# Patient Record
Sex: Male | Born: 1990 | Race: Asian | Hispanic: No | Marital: Single | Smoking: Current some day smoker
Health system: Southern US, Community
[De-identification: ages and names within clinical notes are randomized; demographics above are authoritative.]

## PROBLEM LIST (undated history)

## (undated) DIAGNOSIS — D126 Benign neoplasm of colon, unspecified: Secondary | ICD-10-CM

## (undated) DIAGNOSIS — K52839 Microscopic colitis, unspecified: Secondary | ICD-10-CM

## (undated) HISTORY — PX: EXTERNAL EAR SURGERY: SHX627

## (undated) HISTORY — DX: Benign neoplasm of colon, unspecified: D12.6

## (undated) HISTORY — PX: COLONOSCOPY W/ BIOPSIES: SHX1374

## (undated) HISTORY — DX: Microscopic colitis, unspecified: K52.839

---
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
## 2016-09-11 ENCOUNTER — Encounter (HOSPITAL_COMMUNITY): Payer: Self-pay | Admitting: Emergency Medicine

## 2016-09-11 ENCOUNTER — Ambulatory Visit (HOSPITAL_COMMUNITY)
Admission: EM | Admit: 2016-09-11 | Discharge: 2016-09-11 | Disposition: A | Discharge status: Home / Self Care | Payer: Self-pay | Attending: Emergency Medicine | Admitting: Emergency Medicine

## 2016-09-11 DIAGNOSIS — R350 Frequency of micturition: Secondary | ICD-10-CM | POA: Insufficient documentation

## 2016-09-11 DIAGNOSIS — R300 Dysuria: Secondary | ICD-10-CM | POA: Insufficient documentation

## 2016-09-11 DIAGNOSIS — N342 Other urethritis: Secondary | ICD-10-CM

## 2016-09-11 LAB — POCT URINALYSIS DIP (DEVICE)
BILIRUBIN URINE: NEGATIVE
GLUCOSE, UA: NEGATIVE mg/dL
HGB URINE DIPSTICK: NEGATIVE
Ketones, ur: NEGATIVE mg/dL
LEUKOCYTES UA: NEGATIVE
NITRITE: NEGATIVE
Protein, ur: NEGATIVE mg/dL
Specific Gravity, Urine: 1.015 (ref 1.005–1.030)
UROBILINOGEN UA: 0.2 mg/dL (ref 0.0–1.0)
pH: 6 (ref 5.0–8.0)

## 2016-09-11 MED ORDER — SULFAMETHOXAZOLE-TRIMETHOPRIM 800-160 MG PO TABS: 1.0000 | ORAL_TABLET | Freq: Two times a day (BID) | ORAL | 0 refills | Status: DC

## 2016-09-11 NOTE — Discharge Instructions (Addendum)
Drink plenty of water. Take meds as directed. Follow up with PCP of your choice or [place] for general medical issues. Go to Er for new or worsening issues.

## 2016-09-11 NOTE — ED Triage Notes (Signed)
Pt c/o UTI sx onset 2-3 month associated w/dysuria, urinary freq/urgency, abd pain and HA  Denies fevers, hematuria  A&O x4... NAD

## 2016-09-11 NOTE — ED Provider Notes (Signed)
CSN: [id number]     Arrival date & time 09/11/16  1627 History   None    Chief Complaint  Patient presents with  . Urinary Tract Infection   (Consider location/radiation/quality/duration/timing/severity/associated sxs/prior Treatment) 25 yr old male pt presents with ~ 2 month hx of dribbling with urination, frequency. Denies sexual activity for years. No fever, no N,V,D. New soap used per pt report.    The history is provided by the patient.    History reviewed. No pertinent past medical history. History reviewed. No pertinent surgical history. History reviewed. No pertinent family history. Social History  Substance Use Topics  . Smoking status: Never Smoker  . Smokeless tobacco: Never Used  . Alcohol use Yes    Review of Systems  Constitutional: Negative.   HENT: Negative.   Eyes: Negative.   Respiratory: Negative.   Cardiovascular: Negative.   Gastrointestinal: Negative for abdominal pain, nausea and vomiting.  Endocrine: Negative.   Genitourinary: Positive for frequency. Negative for difficulty urinating, discharge, dysuria, flank pain, hematuria, penile pain, penile swelling, scrotal swelling and testicular pain.  Musculoskeletal: Negative for back pain.  Skin: Negative for rash.  Allergic/Immunologic: Negative.   Neurological: Negative.   Hematological: Negative.   Psychiatric/Behavioral: Negative.   All other systems reviewed and are negative.   Allergies  Review of patient's allergies indicates no known allergies.  Home Medications   Prior to Admission medications   Medication Sig Start Date End Date Taking? Authorizing Provider  sulfamethoxazole-trimethoprim (BACTRIM DS,SEPTRA DS) 800-160 MG tablet Take 1 tablet by mouth 2 (two) times daily. [id number]   Tori Milks, NP   Meds Ordered and Administered this Visit  Medications - No data to display  BP 134/64 (BP Location: Left Arm)   Pulse 61   Temp 98.4 F (36.9 C) (Oral)   Resp 12   SpO2 100%   No data found.   Physical Exam  Constitutional: He is oriented to person, place, and time. Vital signs are normal. He appears well-developed and well-nourished. He is active and cooperative.  Non-toxic appearance. He has a sickly appearance. He does not appear ill. No distress.  HENT:  Head: Normocephalic.  Right Ear: Tympanic membrane normal.  Left Ear: Tympanic membrane normal.  Nose: Nose normal.  Mouth/Throat: Uvula is midline, oropharynx is clear and moist and mucous membranes are normal. Mucous membranes are not dry.  Eyes: Conjunctivae, EOM and lids are normal. Pupils are equal, round, and reactive to light.  Neck: Trachea normal and normal range of motion.  Cardiovascular: Normal rate, regular rhythm, normal heart sounds and normal pulses.   Pulmonary/Chest: Effort normal and breath sounds normal.  Abdominal: Soft. Normal appearance and bowel sounds are normal. There is no tenderness. There is no rebound and no guarding. No hernia.  Musculoskeletal: Normal range of motion.  Neurological: He is alert and oriented to person, place, and time. No cranial nerve deficit or sensory deficit. GCS eye subscore is 4. GCS verbal subscore is 5. GCS motor subscore is 6.  Skin: Skin is warm, dry and intact. No rash noted.  Psychiatric: He has a normal mood and affect. His speech is normal and behavior is normal.  Nursing note and vitals reviewed.   Urgent Care Course   Clinical Course    Procedures (including critical care time)  Labs Review Labs Reviewed  POCT URINALYSIS DIP (DEVICE)  URINE CYTOLOGY ANCILLARY ONLY    Recent Results (from the past 2160 hour(s))  POCT urinalysis dip (device)  Status: None   Collection Time: 09/11/16  5:46 PM  Result Value Ref Range   Glucose, UA NEGATIVE NEGATIVE mg/dL   Bilirubin Urine NEGATIVE NEGATIVE   Ketones, ur NEGATIVE NEGATIVE mg/dL   Specific Gravity, Urine 1.015 1.005 - 1.030   Hgb urine dipstick NEGATIVE NEGATIVE   pH 6.0 5.0 -  8.0   Protein, ur NEGATIVE NEGATIVE mg/dL   Urobilinogen, UA 0.2 0.0 - 1.0 mg/dL   Nitrite NEGATIVE NEGATIVE   Leukocytes, UA NEGATIVE NEGATIVE    Comment: Biochemical Testing Only. Please order routine urinalysis from main lab if confirmatory testing is needed.    Bilateral Near:         MDM   1. Dysuria   2. Urethritis   3. Urinary frequency     Discussed results with pt, will treat for possible urethritis/prostatitis. Drink plenty of water. Take meds as directed. Follow up with PCP of your choice or [place] for general medical issues. Go to Er for new or worsening issues. Pt verbalized understanding ot thsi provider.   DDX: kidney stone, obstruction, UTI, STD.    Tori Milks, NP [id number]

## 2016-09-14 LAB — URINE CYTOLOGY ANCILLARY ONLY
Chlamydia: NEGATIVE
Neisseria Gonorrhea: NEGATIVE
Trichomonas: NEGATIVE

## 2016-10-07 ENCOUNTER — Encounter (HOSPITAL_COMMUNITY): Payer: Self-pay | Admitting: Emergency Medicine

## 2016-10-07 ENCOUNTER — Emergency Department (HOSPITAL_COMMUNITY)
Admission: EM | Admit: 2016-10-07 | Discharge: 2016-10-07 | Disposition: A | Discharge status: Home / Self Care | Payer: Self-pay | Attending: Emergency Medicine | Admitting: Emergency Medicine

## 2016-10-07 ENCOUNTER — Emergency Department (HOSPITAL_COMMUNITY): Payer: Self-pay

## 2016-10-07 DIAGNOSIS — R197 Diarrhea, unspecified: Secondary | ICD-10-CM | POA: Insufficient documentation

## 2016-10-07 DIAGNOSIS — F17200 Nicotine dependence, unspecified, uncomplicated: Secondary | ICD-10-CM | POA: Insufficient documentation

## 2016-10-07 DIAGNOSIS — Z79899 Other long term (current) drug therapy: Secondary | ICD-10-CM | POA: Insufficient documentation

## 2016-10-07 DIAGNOSIS — E860 Dehydration: Secondary | ICD-10-CM | POA: Insufficient documentation

## 2016-10-07 LAB — URINALYSIS, ROUTINE W REFLEX MICROSCOPIC
Bilirubin Urine: NEGATIVE
Glucose, UA: NEGATIVE mg/dL
Hgb urine dipstick: NEGATIVE
Ketones, ur: NEGATIVE mg/dL
Leukocytes, UA: NEGATIVE
Nitrite: NEGATIVE
Protein, ur: NEGATIVE mg/dL
Specific Gravity, Urine: 1.022 (ref 1.005–1.030)
pH: 6.5 (ref 5.0–8.0)

## 2016-10-07 LAB — COMPREHENSIVE METABOLIC PANEL
ALBUMIN: 4.3 g/dL (ref 3.5–5.0)
ALK PHOS: 59 U/L (ref 38–126)
ALT: 59 U/L (ref 17–63)
AST: 39 U/L (ref 15–41)
Anion gap: 8 (ref 5–15)
BILIRUBIN TOTAL: 0.5 mg/dL (ref 0.3–1.2)
BUN: 19 mg/dL (ref 6–20)
CALCIUM: 9 mg/dL (ref 8.9–10.3)
CO2: 27 mmol/L (ref 22–32)
CREATININE: 1.25 mg/dL — AB (ref 0.61–1.24)
Chloride: 102 mmol/L (ref 101–111)
GFR calc Af Amer: >60 mL/min (ref >60)
GLUCOSE: 91 mg/dL (ref 65–99)
POTASSIUM: 3.6 mmol/L (ref 3.5–5.1)
Sodium: 137 mmol/L (ref 135–145)
TOTAL PROTEIN: 7.4 g/dL (ref 6.5–8.1)

## 2016-10-07 LAB — CBC WITH DIFFERENTIAL/PLATELET
BASOS ABS: 0 10*3/uL (ref 0.0–0.1)
BASOS PCT: 0 %
Eosinophils Absolute: 0.1 10*3/uL (ref 0.0–0.7)
Eosinophils Relative: 2 %
HEMATOCRIT: 40.1 % (ref 39.0–52.0)
HEMOGLOBIN: 13.6 g/dL (ref 13.0–17.0)
LYMPHS PCT: 28 %
Lymphs Abs: 2.4 10*3/uL (ref 0.7–4.0)
MCH: 25.8 pg — ABNORMAL LOW (ref 26.0–34.0)
MCHC: 33.9 g/dL (ref 30.0–36.0)
MCV: 76.1 fL — AB (ref 78.0–100.0)
Monocytes Absolute: 0.6 10*3/uL (ref 0.1–1.0)
Monocytes Relative: 7 %
NEUTROS ABS: 5.5 10*3/uL (ref 1.7–7.7)
NEUTROS PCT: 63 %
Platelets: 260 10*3/uL (ref 150–400)
RBC: 5.27 MIL/uL (ref 4.22–5.81)
RDW: 13 % (ref 11.5–15.5)
WBC: 8.6 10*3/uL (ref 4.0–10.5)

## 2016-10-07 LAB — PROTIME-INR
INR: 0.95
Prothrombin Time: 12.7 seconds (ref 11.4–15.2)

## 2016-10-07 LAB — LIPASE, BLOOD: Lipase: 20 U/L (ref 11–51)

## 2016-10-07 MED ORDER — DICYCLOMINE HCL 20 MG PO TABS: 20.0000 mg | ORAL_TABLET | Freq: Three times a day (TID) | ORAL | 0 refills | Status: DC

## 2016-10-07 MED ORDER — LOPERAMIDE HCL 2 MG PO CAPS: 2.0000 mg | ORAL_CAPSULE | Freq: Four times a day (QID) | ORAL | 0 refills | Status: DC | PRN

## 2016-10-07 NOTE — ED Provider Notes (Signed)
[facility] DEPT Provider Note   CSN: [id number] Arrival date & time: 10/07/16  1722     History   Chief Complaint Chief Complaint  Patient presents with  . Urinary Tract Infection    HPI Eric Sharp is a 25 y.o. male.  HPI 25 year old male with no significant past medical history presents with diffuse abdominal pain, diarrhea, and intermittent urinary frequency. The patient states that over the last 6 weeks, he has had intermittent, crampy-like, diffuse abdominal pain. It is usually associated with eating and then is associated with nonbloody, non-bilious diarrhea. He has had no nausea or vomiting over this time. He also notes intermittent abdominal distention that improves with bowel movements. Of note, he also states that when he gets the cramps, he occasionally gets the urge to urinate. He was seen recently at urgent care for these symptoms and prescribed Bactrim for possible UTI, although patient has no fever or other symptoms. Urinalysis at urgent care was unremarkable. Denies any other systemic complaints. Denies any recent travel outside the [place]. No recent antibiotic use. Of note, he does endorse heavy alcohol use daily.  History reviewed. No pertinent past medical history.  There are no active problems to display for this patient.   History reviewed. No pertinent surgical history.     Home Medications    Prior to Admission medications   Medication Sig Start Date End Date Taking? Authorizing Provider  dicyclomine (BENTYL) 20 MG tablet Take 1 tablet (20 mg total) by mouth 4 (four) times daily -  before meals and at bedtime. 10/07/16   Duffy Bruce, MD  loperamide (IMODIUM) 2 MG capsule Take 1 capsule (2 mg total) by mouth 4 (four) times daily as needed for diarrhea or loose stools. 10/07/16   Duffy Bruce, MD  sulfamethoxazole-trimethoprim (BACTRIM DS,SEPTRA DS) 800-160 MG tablet Take 1 tablet by mouth 2 (two) times daily. [id number]   Tori Milks, NP     Family History History reviewed. No pertinent family history.  Social History Social History  Substance Use Topics  . Smoking status: Current Every Day Smoker  . Smokeless tobacco: Never Used  . Alcohol use Yes     Allergies   Patient has no known allergies.   Review of Systems Review of Systems  Constitutional: Negative for chills, fatigue and fever.  HENT: Negative for congestion and rhinorrhea.   Eyes: Negative for visual disturbance.  Respiratory: Negative for cough, shortness of breath and wheezing.   Cardiovascular: Negative for chest pain and leg swelling.  Gastrointestinal: Positive for abdominal distention and diarrhea. Negative for abdominal pain, blood in stool and vomiting.  Genitourinary: Negative for dysuria and flank pain.  Musculoskeletal: Negative for neck pain and neck stiffness.  Skin: Negative for rash and wound.  Allergic/Immunologic: Negative for immunocompromised state.  Neurological: Negative for syncope, weakness and headaches.  All other systems reviewed and are negative.    Physical Exam Updated Vital Signs BP 139/84 (BP Location: Left Arm)   Pulse 79   Temp 98.6 F (37 C) (Oral)   Resp 20   Ht 5\' 5"  (1.651 m)   Wt 185 lb (83.9 kg)   SpO2 97%   BMI 30.79 kg/m   Physical Exam  Constitutional: He is oriented to person, place, and time. He appears well-developed and well-nourished. No distress.  HENT:  Head: Normocephalic and atraumatic.  Eyes: Conjunctivae are normal.  Neck: Neck supple.  Cardiovascular: Normal rate, regular rhythm and normal heart sounds.  Exam reveals no friction rub.  No murmur heard. Pulmonary/Chest: Effort normal and breath sounds normal. No respiratory distress. He has no wheezes. He has no rales.  Abdominal: Soft. Bowel sounds are normal. He exhibits no distension. There is no tenderness. There is no rebound and no guarding.  Musculoskeletal: He exhibits no edema.  Neurological: He is alert and oriented  to person, place, and time. He exhibits normal muscle tone.  Skin: Skin is warm. Capillary refill takes less than 2 seconds.  Psychiatric: He has a normal mood and affect.  Nursing note and vitals reviewed.    ED Treatments / Results  Labs (all labs ordered are listed, but only abnormal results are displayed) Labs Reviewed  CBC WITH DIFFERENTIAL/PLATELET - Abnormal; Notable for the following:       Result Value   MCV 76.1 (*)    MCH 25.8 (*)    All other components within normal limits  COMPREHENSIVE METABOLIC PANEL - Abnormal; Notable for the following:    Creatinine, Ser 1.25 (*)    All other components within normal limits  GASTROINTESTINAL PANEL BY PCR, STOOL (REPLACES STOOL CULTURE)  URINALYSIS, ROUTINE W REFLEX MICROSCOPIC (NOT AT [facility])  PROTIME-INR  LIPASE, BLOOD    EKG  EKG Interpretation None       Radiology Dg Abdomen 1 View  Result Date: 10/07/2016 CLINICAL DATA:  Abdominal distention and diarrhea EXAM: ABDOMEN - 1 VIEW COMPARISON:  None. FINDINGS: There is moderate stool throughout the colon. There is no bowel dilatation or air-fluid level suggesting bowel obstruction. No free air. No abnormal calcifications. IMPRESSION: Moderate stool in colon.  No bowel obstruction or free air evident. Electronically Signed   By: Lowella Grip III M.D.   On: 10/07/2016 19:40    Procedures Procedures (including critical care time)  Medications Ordered in ED Medications - No data to display   Initial Impression / Assessment and Plan / ED Course  I have reviewed the triage vital signs and the nursing notes.  Pertinent labs & imaging results that were available during my care of the patient were reviewed by me and considered in my medical decision making (see chart for details).  Clinical Course     25 yo M with PMHx of heavy EtOH abuse who p/w a several week h/o watery, non-bloody diarrhea with intermittent abdominal cramping. On arrival, VSS and WNL. Exam  reassuring with minimal to no TTP of abdomen, no ascites. Suspect viral GI illness, malabsorption diarrhea 2/2 EtOH use and poor diet, versus other infectious diarrhea. No recent travel outside of [place]. 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 No bloody stools, fevers, and labs reassuring - do not suspect significant bacterial colitis, diverticulitis, or IBD. LFTs wnl. UA without signs of UTI or dehydration. KUB non-obstructive with no apparent soft tissue masses.  Given reassuring labs, vitals, well appearance, and tolerance of PO, will obtain GI pathogen panel and d/c with symptomatic control. Will notify pt if results positive. Return precautions given. Advised pt to decrease EtOH use.  Pt home phone: [phone]  Final Clinical Impressions(s) / ED Diagnoses   Final diagnoses:  Diarrhea of presumed infectious origin  Dehydration    New Prescriptions New Prescriptions   DICYCLOMINE (BENTYL) 20 MG TABLET    Take 1 tablet (20 mg total) by mouth 4 (four) times daily -  before meals and at bedtime.   LOPERAMIDE (IMODIUM) 2 MG CAPSULE    Take 1 capsule (2 mg total) by mouth 4 (four) times daily as  needed for diarrhea or loose stools.     Duffy Bruce, MD 10/08/16 [phone]

## 2016-10-07 NOTE — ED Triage Notes (Signed)
Pt here with dysuria and frequency x 3 days

## 2016-10-07 NOTE — ED Notes (Signed)
Pt reports dysuria and abd pain with diarrhea x 4 weeks.  States he was seen about a week ago at the [facility] and was given Bactrim d/t UTI without relief.  Pt states having immediate diarrhea after eating, denies any pain but reports feeling "fullness" in his abdomen.

## 2016-10-10 ENCOUNTER — Telehealth (HOSPITAL_COMMUNITY): Payer: Self-pay | Admitting: Emergency Medicine

## 2016-10-10 NOTE — Telephone Encounter (Signed)
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 Patient previously on antibiotics prescribed by Urgent Care. I attempted to call patient x 2 and left a VM notifying him of results and advising him to: (1) stop taking antibiotics, as this can worsen the condition, (2) avoid anti-diarrheals, as this can prolong infection, and (3) encourage fluids, with return to ED if not improved. During ED encounter, patient had provided consent to leave VM as preferred method of communication. ED Follow-Up pool also aware. No new orders indicated.

## 2016-10-12 ENCOUNTER — Telehealth (HOSPITAL_COMMUNITY): Payer: Self-pay | Admitting: *Deleted

## 2016-10-12 NOTE — Telephone Encounter (Signed)
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 He was previously on antibiotics and was taking immodium for this. I was wondering if you could please notify the patient of his results and advise him to  1.) Stop taking his antibiotics, as this may worsen his condition 2.) Stop taking immodium as this may prolong the course of his illness  Please let me know if there's anything I can do.  Thanks, Duffy Bruce

## 2016-10-19 LAB — GASTROINTESTINAL PANEL BY PCR, STOOL (REPLACES STOOL CULTURE)
ASTROVIRUS: NOT DETECTED
Adenovirus F40/41: NOT DETECTED
CAMPYLOBACTER SPECIES: NOT DETECTED
Cryptosporidium: NOT DETECTED
Cyclospora cayetanensis: NOT DETECTED
E. coli O157: NOT DETECTED
ENTAMOEBA HISTOLYTICA: NOT DETECTED
ENTEROAGGREGATIVE E COLI (EAEC): NOT DETECTED
ENTEROPATHOGENIC E COLI (EPEC): NOT DETECTED
ENTEROTOXIGENIC E COLI (ETEC): NOT DETECTED
GIARDIA LAMBLIA: NOT DETECTED
NOROVIRUS GI/GII: NOT DETECTED
PLESIMONAS SHIGELLOIDES: NOT DETECTED
Rotavirus A: NOT DETECTED
SALMONELLA SPECIES: NOT DETECTED
SHIGELLA/ENTEROINVASIVE E COLI (EIEC): NOT DETECTED
Sapovirus (I, II, IV, and V): NOT DETECTED
Shiga like toxin producing E coli (STEC): DETECTED — AB
VIBRIO CHOLERAE: NOT DETECTED
Vibrio species: NOT DETECTED
Yersinia enterocolitica: NOT DETECTED

## 2016-10-27 LAB — MISCELLANEOUS TEST

## 2017-02-12 ENCOUNTER — Encounter (HOSPITAL_COMMUNITY): Payer: Self-pay

## 2017-02-12 ENCOUNTER — Emergency Department (HOSPITAL_COMMUNITY)
Admission: EM | Admit: 2017-02-12 | Discharge: 2017-02-12 | Disposition: A | Discharge status: Home / Self Care | Payer: Self-pay | Attending: Emergency Medicine | Admitting: Emergency Medicine

## 2017-02-12 DIAGNOSIS — F17200 Nicotine dependence, unspecified, uncomplicated: Secondary | ICD-10-CM | POA: Insufficient documentation

## 2017-02-12 DIAGNOSIS — R350 Frequency of micturition: Secondary | ICD-10-CM | POA: Insufficient documentation

## 2017-02-12 DIAGNOSIS — Z5321 Procedure and treatment not carried out due to patient leaving prior to being seen by health care provider: Secondary | ICD-10-CM | POA: Insufficient documentation

## 2017-02-12 DIAGNOSIS — Z79899 Other long term (current) drug therapy: Secondary | ICD-10-CM | POA: Insufficient documentation

## 2017-02-12 LAB — URINALYSIS, ROUTINE W REFLEX MICROSCOPIC
Bilirubin Urine: NEGATIVE
GLUCOSE, UA: NEGATIVE mg/dL
Hgb urine dipstick: NEGATIVE
KETONES UR: NEGATIVE mg/dL
LEUKOCYTES UA: NEGATIVE
NITRITE: NEGATIVE
PROTEIN: NEGATIVE mg/dL
Specific Gravity, Urine: 1.026 (ref 1.005–1.030)
pH: 5 (ref 5.0–8.0)

## 2017-02-12 NOTE — ED Provider Notes (Signed)
Patient apparently left without being seen by a provider.    Merrily Pew, MD 02/12/17 2053

## 2017-02-12 NOTE — ED Triage Notes (Signed)
Pt complaining of urinary frequency. Pt states needs to use restroom every few hours. Pt denies any painful urination. Pt denies any N/V/D. Pt denies any abdominal pain. Pt states seen here for similar issue. Pt states rx'd some medication for problem.

## 2017-03-10 ENCOUNTER — Emergency Department (HOSPITAL_COMMUNITY)
Admission: EM | Admit: 2017-03-10 | Discharge: 2017-03-10 | Disposition: A | Discharge status: Home / Self Care | Payer: Self-pay | Attending: Emergency Medicine | Admitting: Emergency Medicine

## 2017-03-10 DIAGNOSIS — R300 Dysuria: Secondary | ICD-10-CM

## 2017-03-10 DIAGNOSIS — E860 Dehydration: Secondary | ICD-10-CM | POA: Insufficient documentation

## 2017-03-10 DIAGNOSIS — F17200 Nicotine dependence, unspecified, uncomplicated: Secondary | ICD-10-CM | POA: Insufficient documentation

## 2017-03-10 LAB — URINALYSIS, ROUTINE W REFLEX MICROSCOPIC
Bilirubin Urine: NEGATIVE
Glucose, UA: NEGATIVE mg/dL
Hgb urine dipstick: NEGATIVE
Ketones, ur: NEGATIVE mg/dL
Leukocytes, UA: NEGATIVE
Nitrite: NEGATIVE
Protein, ur: NEGATIVE mg/dL
Specific Gravity, Urine: 1.023 (ref 1.005–1.030)
pH: 6 (ref 5.0–8.0)

## 2017-03-10 NOTE — ED Notes (Signed)
Papers and D/C instructions reviewed after PA went of instructions

## 2017-03-10 NOTE — ED Provider Notes (Signed)
[facility] DEPT Provider Note   CSN: [id number] Arrival date & time: 03/10/17  1452   By signing my name below, I, Evelene Croon, attest that this documentation has been prepared under the direction and in the presence of Shary Decamp, PA-C. Electronically Signed: Evelene Croon, Scribe. 03/10/2017. 3:40 PM.  History   Chief Complaint Chief Complaint  Patient presents with  . Dysuria   The history is provided by the patient. The history is limited by a language barrier. No language interpreter was used.     HPI Comments:  Eric Sharp is a 26 y.o. male who presents to the Emergency Department complaining of dysuria with associated urinary frequency x ~ 1 year. Pt states he has ben trying to come in to be evaluated for a while but has been busy.  Pt notes he often holds in his urine because he drives so much. Pt also notes he doesn't drink very much water daily. Pt denies penile discharge, hematuria, fever, abdominal pain, and back pain. No alleviating factors noted.   No past medical history on file.  There are no active problems to display for this patient.   No past surgical history on file.   Home Medications    Prior to Admission medications   Medication Sig Start Date End Date Taking? Authorizing Provider  dicyclomine (BENTYL) 20 MG tablet Take 1 tablet (20 mg total) by mouth 4 (four) times daily -  before meals and at bedtime. 10/07/16   Duffy Bruce, MD  loperamide (IMODIUM) 2 MG capsule Take 1 capsule (2 mg total) by mouth 4 (four) times daily as needed for diarrhea or loose stools. 10/07/16   Duffy Bruce, MD  sulfamethoxazole-trimethoprim (BACTRIM DS,SEPTRA DS) 800-160 MG tablet Take 1 tablet by mouth 2 (two) times daily. 32/20/25   Tori Milks, NP    Family History No family history on file.  Social History Social History  Substance Use Topics  . Smoking status: Current Every Day Smoker  . Smokeless tobacco: Never Used  . Alcohol use Yes     Allergies     Patient has no known allergies.   Review of Systems Review of Systems  Constitutional: Negative for chills and fever.  Respiratory: Negative for shortness of breath.   Cardiovascular: Negative for chest pain.  Genitourinary: Positive for dysuria and frequency. Negative for discharge and hematuria.   Physical Exam Updated Vital Signs BP 133/77 (BP Location: Left Arm)   Pulse 74   Temp 98.4 F (36.9 C) (Oral)   Resp 16   SpO2 99%   Physical Exam  Constitutional: He is oriented to person, place, and time. Vital signs are normal. He appears well-developed and well-nourished. No distress.  HENT:  Head: Normocephalic and atraumatic.  Right Ear: Hearing normal.  Left Ear: Hearing normal.  Eyes: Conjunctivae and EOM are normal. Pupils are equal, round, and reactive to light.  Cardiovascular: Normal rate and regular rhythm.   Pulmonary/Chest: Effort normal.  Abdominal: Soft. Bowel sounds are normal. He exhibits no distension. There is no tenderness. There is no CVA tenderness.  Neurological: He is alert and oriented to person, place, and time.  Skin: Skin is warm and dry.  Psychiatric: He has a normal mood and affect. His speech is normal and behavior is normal. Thought content normal.  Nursing note and vitals reviewed.  ED Treatments / Results  DIAGNOSTIC STUDIES:  Oxygen Saturation is 99% on RA, normal by my interpretation.    COORDINATION OF CARE:  3:34 PM Discussed  treatment plan with pt at bedside and pt agreed to plan.  Labs (all labs ordered are listed, but only abnormal results are displayed) Labs Reviewed  URINALYSIS, ROUTINE W REFLEX MICROSCOPIC   EKG  EKG Interpretation None      Radiology No results found.  Procedures Procedures (including critical care time)  Medications Ordered in ED Medications - No data to display   Initial Impression / Assessment and Plan / ED Course  I have reviewed the triage vital signs and the nursing notes.  Pertinent  labs & imaging results that were available during my care of the patient were reviewed by me and considered in my medical decision making (see chart for details).  {I have reviewed and evaluated the relevant laboratory values.   {I have reviewed the relevant previous healthcare records.  {I obtained HPI from historian.   ED Course:  Assessment: Pt is a 26 y.o. male who presents with intermittent dysuria x 1 year. Occasional urinary frequency. Pt states this occurs in the morning as well as prolonged periods when he does not drink water. Denies currently. No N/V. No fever. No back pain. No abdominal pain. On exam, pt in NAD. Nontoxic/nonseptic appearing. VSS. Afebrile. Abdomen nontender soft. UA unremarkable. Counseled on adequate hydration. Pt symptoms likely related to dehydration causing mild dysuria due to concentrated urine. Plan is to DC home with follow up to PCP. At time of discharge, Patient is in no acute distress. Vital Signs are stable. Patient is able to ambulate. Patient able to tolerate PO.   Disposition/Plan:  DC Home Additional Verbal discharge instructions given and discussed with patient.  Pt Instructed to f/u with PCP in the next week for evaluation and treatment of symptoms. Return precautions given Pt acknowledges and agrees with plan  Supervising Physician Virgel Manifold, MD   Final Clinical Impressions(s) / ED Diagnoses   Final diagnoses:  Dysuria  Dehydration    New Prescriptions New Prescriptions   No medications on file   I personally performed the services described in this documentation, which was scribed in my presence. The recorded information has been reviewed and is accurate.    Shary Decamp, PA-C 03/10/17 [place], MD 03/13/17 [phone]

## 2017-03-10 NOTE — ED Triage Notes (Signed)
Pt arrives via POv from home with dysuria for the last year. Pt c/o frequency, urgency and burning. Denies recent fever. VSS.

## 2017-03-10 NOTE — ED Notes (Signed)
PA at bedside.

## 2017-03-10 NOTE — Discharge Instructions (Signed)
Please read and follow all provided instructions.  Your diagnoses today include:  1. Dysuria   2. Dehydration     Tests performed today include: Vital signs. See below for your results today.   Medications prescribed:  Take as prescribed   Home care instructions:  Follow any educational materials contained in this packet.  Follow-up instructions: Please follow-up with your primary care provider for further evaluation of symptoms and treatment   Return instructions:  Please return to the Emergency Department if you do not get better, if you get worse, or new symptoms OR  - Fever (temperature greater than 101.57F)  - Bleeding that does not stop with holding pressure to the area    -Severe pain (please note that you may be more sore the day after your accident)  - Chest Pain  - Difficulty breathing  - Severe nausea or vomiting  - Inability to tolerate food and liquids  - Passing out  - Skin becoming red around your wounds  - Change in mental status (confusion or lethargy)  - New numbness or weakness    Please return if you have any other emergent concerns.  Additional Information:  Your vital signs today were: BP 133/77 (BP Location: Left Arm)    Pulse 74    Temp 98.4 F (36.9 C) (Oral)    Resp 16    SpO2 99%  If your blood pressure (BP) was elevated above 135/85 this visit, please have this repeated by your doctor within one month. ---------------

## 2017-07-03 ENCOUNTER — Ambulatory Visit (HOSPITAL_COMMUNITY)
Admission: EM | Admit: 2017-07-03 | Discharge: 2017-07-03 | Disposition: A | Discharge status: Home / Self Care | Payer: PRIVATE HEALTH INSURANCE | Attending: Radiology | Admitting: Radiology

## 2017-07-03 ENCOUNTER — Encounter (HOSPITAL_COMMUNITY): Payer: Self-pay | Admitting: *Deleted

## 2017-07-03 DIAGNOSIS — R109 Unspecified abdominal pain: Secondary | ICD-10-CM

## 2017-07-03 DIAGNOSIS — R197 Diarrhea, unspecified: Secondary | ICD-10-CM | POA: Diagnosis not present

## 2017-07-03 DIAGNOSIS — F17200 Nicotine dependence, unspecified, uncomplicated: Secondary | ICD-10-CM | POA: Insufficient documentation

## 2017-07-03 LAB — POCT H PYLORI SCREEN: H. PYLORI SCREEN, POC: POSITIVE — AB

## 2017-07-03 LAB — POCT URINALYSIS DIP (DEVICE)
BILIRUBIN URINE: NEGATIVE
GLUCOSE, UA: NEGATIVE mg/dL
Ketones, ur: NEGATIVE mg/dL
Leukocytes, UA: NEGATIVE
NITRITE: NEGATIVE
PH: 6 (ref 5.0–8.0)
PROTEIN: NEGATIVE mg/dL
Specific Gravity, Urine: 1.025 (ref 1.005–1.030)
Urobilinogen, UA: 0.2 mg/dL (ref 0.0–1.0)

## 2017-07-03 MED ORDER — BISMUTH SUBSALICYLATE 262 MG/15ML PO SUSP: 30.0000 mL | Freq: Four times a day (QID) | ORAL | 0 refills | Status: AC | PRN

## 2017-07-03 MED ORDER — TETRACYCLINE HCL 250 MG PO CAPS
250.0000 mg | ORAL_CAPSULE | Freq: Four times a day (QID) | ORAL | 0 refills | Status: AC
Start: 2017-07-03 — End: 2017-07-17

## 2017-07-03 MED ORDER — METRONIDAZOLE 500 MG PO TABS: 500.0000 mg | ORAL_TABLET | Freq: Two times a day (BID) | ORAL | 0 refills | Status: DC

## 2017-07-03 MED ORDER — CIPROFLOXACIN HCL 500 MG PO TABS: 500.0000 mg | ORAL_TABLET | Freq: Two times a day (BID) | ORAL | 0 refills | Status: DC

## 2017-07-03 NOTE — ED Triage Notes (Signed)
Pt  Reports  Low standing    History  Of  Frequent urination     As   Well as   abd  Pain    He  Reports   Loose  Stools  But  Not  Diarrhea   As   Well      Pt  Is  Sitting  Upright on  Exam table  Speaking in  Complete  sentances

## 2017-07-03 NOTE — ED Provider Notes (Signed)
CSN: [id number]     Arrival date & time 07/03/17  1403 History   First MD Initiated Contact with Patient 07/03/17 1436     Chief Complaint  Patient presents with  . Abdominal Pain   (Consider location/radiation/quality/duration/timing/severity/associated sxs/prior Treatment) 26 y.o. male presents with abdominal pain and increase in urinary frequency X 6-7  months. Patient denies any nausea, vomiting. Endorses diarrhea upon waking and after eating. Condition is chronic in nature. Condition is made better as the day progresses. Condition is made worse by sodas and pepper. Patient denies any relief from unknown prescribed mediation given approximately 4-5  Months  prior to there arrival at this facility.        History reviewed. No pertinent past medical history. History reviewed. No pertinent surgical history. History reviewed. No pertinent family history. Social History  Substance Use Topics  . Smoking status: Current Every Day Smoker  . Smokeless tobacco: Never Used  . Alcohol use Yes    Review of Systems  Constitutional: Negative for chills and fever.  HENT: Negative for ear pain and sore throat.   Eyes: Negative for pain and visual disturbance.  Respiratory: Negative for cough and shortness of breath.   Cardiovascular: Negative for chest pain and palpitations.  Gastrointestinal: Positive for abdominal pain and diarrhea. Negative for vomiting.  Genitourinary: Positive for frequency. Negative for dysuria and hematuria.  Musculoskeletal: Negative for arthralgias and back pain.  Skin: Negative for color change and rash.  Neurological: Negative for seizures and syncope.  All other systems reviewed and are negative.   Allergies  Patient has no known allergies.  Home Medications   Prior to Admission medications   Medication Sig Start Date End Date Taking? Authorizing Provider  bismuth subsalicylate (PEPTO-BISMOL) 262 MG/15ML suspension Take 30 mLs by mouth every 6 (six) hours  as needed. 07/03/17 07/17/17  Jacqualine Mau, NP  ciprofloxacin (CIPRO) 500 MG tablet Take 1 tablet (500 mg total) by mouth every 12 (twelve) hours. 07/03/17   Jacqualine Mau, NP  dicyclomine (BENTYL) 20 MG tablet Take 1 tablet (20 mg total) by mouth 4 (four) times daily -  before meals and at bedtime. 10/07/16   Duffy Bruce, MD  loperamide (IMODIUM) 2 MG capsule Take 1 capsule (2 mg total) by mouth 4 (four) times daily as needed for diarrhea or loose stools. 10/07/16   Duffy Bruce, MD  metroNIDAZOLE (FLAGYL) 500 MG tablet Take 1 tablet (500 mg total) by mouth 2 (two) times daily. 07/03/17   Jacqualine Mau, NP  sulfamethoxazole-trimethoprim (BACTRIM DS,SEPTRA DS) 800-160 MG tablet Take 1 tablet by mouth 2 (two) times daily. 40/98/11   Defelice, Jeanett Schlein, NP  tetracycline (ACHROMYCIN,SUMYCIN) 250 MG capsule Take 1 capsule (250 mg total) by mouth 4 (four) times daily. 07/03/17 07/17/17  Jacqualine Mau, NP   Meds Ordered and Administered this Visit  Medications - No data to display  BP 130/68 (BP Location: Right Arm)   Pulse 80   Temp 98.6 F (37 C) (Oral)   Resp 18   SpO2 100%  No data found.   Physical Exam  Constitutional: He appears well-developed and well-nourished.  HENT:  Head: Normocephalic and atraumatic.  Eyes: Conjunctivae are normal.  Neck: Neck supple.  Cardiovascular: Normal rate and regular rhythm.   No murmur heard. Pulmonary/Chest: Effort normal and breath sounds normal. No respiratory distress.  Abdominal: Soft. There is no tenderness.  Musculoskeletal: He exhibits no edema.  Neurological: He is alert.  Skin: Skin is warm and  dry.  Psychiatric: He has a normal mood and affect.  Nursing note and vitals reviewed.   Urgent Care Course     Procedures (including critical care time)  Labs Review Labs Reviewed  POCT H PYLORI SCREEN - Abnormal; Notable for the following:       Result Value   H. PYLORI SCREEN, POC POSITIVE (*)    All other  components within normal limits  POCT URINALYSIS DIP (DEVICE) - Abnormal; Notable for the following:    Hgb urine dipstick TRACE (*)    All other components within normal limits  URINE CULTURE    Imaging Review No results found.     MDM   1. Abdominal pain, unspecified abdominal location       Jacqualine Mau, NP 07/03/17 1523

## 2017-07-04 LAB — URINE CULTURE: CULTURE: NO GROWTH

## 2017-10-12 ENCOUNTER — Encounter: Payer: Self-pay | Admitting: Physician Assistant

## 2017-10-26 ENCOUNTER — Encounter (INDEPENDENT_AMBULATORY_CARE_PROVIDER_SITE_OTHER): Payer: Self-pay

## 2017-10-26 ENCOUNTER — Ambulatory Visit: Payer: PRIVATE HEALTH INSURANCE | Admitting: Physician Assistant

## 2017-10-26 ENCOUNTER — Other Ambulatory Visit (INDEPENDENT_AMBULATORY_CARE_PROVIDER_SITE_OTHER): Payer: PRIVATE HEALTH INSURANCE

## 2017-10-26 ENCOUNTER — Encounter: Payer: Self-pay | Admitting: Physician Assistant

## 2017-10-26 VITALS — BP 106/72 | HR 84 | Ht 63.0 in | Wt 186.0 lb

## 2017-10-26 DIAGNOSIS — R195 Other fecal abnormalities: Secondary | ICD-10-CM

## 2017-10-26 DIAGNOSIS — R1084 Generalized abdominal pain: Secondary | ICD-10-CM

## 2017-10-26 DIAGNOSIS — R109 Unspecified abdominal pain: Secondary | ICD-10-CM

## 2017-10-26 LAB — CBC WITH DIFFERENTIAL/PLATELET
BASOS PCT: 0.4 % (ref 0.0–3.0)
Basophils Absolute: 0 10*3/uL (ref 0.0–0.1)
EOS PCT: 2.1 % (ref 0.0–5.0)
Eosinophils Absolute: 0.1 10*3/uL (ref 0.0–0.7)
HCT: 45.5 % (ref 39.0–52.0)
Hemoglobin: 15.2 g/dL (ref 13.0–17.0)
LYMPHS ABS: 2.3 10*3/uL (ref 0.7–4.0)
LYMPHS PCT: 37.3 % (ref 12.0–46.0)
MCHC: 33.4 g/dL (ref 30.0–36.0)
MCV: 78.1 fl (ref 78.0–100.0)
MONO ABS: 0.4 10*3/uL (ref 0.1–1.0)
Monocytes Relative: 6.2 % (ref 3.0–12.0)
NEUTROS ABS: 3.3 10*3/uL (ref 1.4–7.7)
NEUTROS PCT: 54 % (ref 43.0–77.0)
PLATELETS: 338 10*3/uL (ref 150.0–400.0)
RBC: 5.83 Mil/uL — ABNORMAL HIGH (ref 4.22–5.81)
RDW: 13.1 % (ref 11.5–15.5)
WBC: 6.2 10*3/uL (ref 4.0–10.5)

## 2017-10-26 LAB — HIGH SENSITIVITY CRP: CRP, High Sensitivity: 2.47 mg/L (ref 0.000–5.000)

## 2017-10-26 LAB — COMPREHENSIVE METABOLIC PANEL
ALBUMIN: 5.1 g/dL (ref 3.5–5.2)
ALK PHOS: 68 U/L (ref 39–117)
ALT: 49 U/L (ref 0–53)
AST: 27 U/L (ref 0–37)
BUN: 14 mg/dL (ref 6–23)
CALCIUM: 10 mg/dL (ref 8.4–10.5)
CO2: 27 mEq/L (ref 19–32)
CREATININE: 1.08 mg/dL (ref 0.40–1.50)
Chloride: 100 mEq/L (ref 96–112)
GFR: 88.87 mL/min (ref >60.00)
Glucose, Bld: 90 mg/dL (ref 70–99)
POTASSIUM: 4 meq/L (ref 3.5–5.1)
SODIUM: 138 meq/L (ref 135–145)
TOTAL PROTEIN: 8.8 g/dL — AB (ref 6.0–8.3)
Total Bilirubin: 0.7 mg/dL (ref 0.2–1.2)

## 2017-10-26 LAB — SEDIMENTATION RATE: Sed Rate: 13 mm/hr (ref 0–15)

## 2017-10-26 MED ORDER — DICYCLOMINE HCL 10 MG PO CAPS: 10.0000 mg | ORAL_CAPSULE | Freq: Three times a day (TID) | ORAL | 2 refills | Status: DC

## 2017-10-26 NOTE — Progress Notes (Signed)
Subjective:    Patient ID: Eric Sharp, male    DOB: [birth year], 26 y.o.   MRN: [id number]  HPI Chipley is a pleasant 26 year old [place] male, new to GI today. He states he was referred by [facility] after a visit there in August 2018. He presents with complaints of ongoing abdominal pain. He was found to be H. pylori positive when he had the urgent care visit in August and states that he was treated with antibiotics but his symptoms did not change. Patient states that these had ongoing abdominal pain over the past year which has waxed and waned at times but never completely goes away. He says his oral abdomen is uncomfortable and he describes it as a burning or pressure type feeling. He feels more pressure in his lower abdomen. He has been having at least 2-3 loose bowel movements each morning which is new for him over the past year and also having urgency postprandially with cramping and loose stools. His appetite has been okay, no nausea or vomiting, no weight loss. He is not aware of any fevers or chills. He says he is intolerant to sodas and Green pepper. He is also concerned because he is been having very frequent urination over the past year, at least once per hour during the day. Family history is negative for GI diseases far as he is aware. He has not had any recent imaging.  Review of Systems Pertinent positive and negative review of systems were noted in the above HPI section.  All other review of systems was otherwise negative.  Outpatient Encounter Medications as of 10/26/2017  Medication Sig  . dicyclomine (BENTYL) 10 MG capsule Take 1 capsule (10 mg total) by mouth 3 (three) times daily.  . [DISCONTINUED] ciprofloxacin (CIPRO) 500 MG tablet Take 1 tablet (500 mg total) by mouth every 12 (twelve) hours.  . [DISCONTINUED] dicyclomine (BENTYL) 20 MG tablet Take 1 tablet (20 mg total) by mouth 4 (four) times daily -  before meals and at bedtime.  . [DISCONTINUED] loperamide  (IMODIUM) 2 MG capsule Take 1 capsule (2 mg total) by mouth 4 (four) times daily as needed for diarrhea or loose stools.  . [DISCONTINUED] metroNIDAZOLE (FLAGYL) 500 MG tablet Take 1 tablet (500 mg total) by mouth 2 (two) times daily.  . [DISCONTINUED] sulfamethoxazole-trimethoprim (BACTRIM DS,SEPTRA DS) 800-160 MG tablet Take 1 tablet by mouth 2 (two) times daily.   No facility-administered encounter medications on file as of 10/26/2017.    No Known Allergies There are no active problems to display for this patient.  Social History   Socioeconomic History  . Marital status: Single    Spouse name: Not on file  . Number of children: 0  . Years of education: Not on file  . Highest education level: Not on file  Social Needs  . Financial resource strain: Not on file  . Food insecurity - worry: Not on file  . Food insecurity - inability: Not on file  . Transportation needs - medical: Not on file  . Transportation needs - non-medical: Not on file  Occupational History  . Not on file  Tobacco Use  . Smoking status: Current Every Day Smoker  . Smokeless tobacco: Never Used  Substance and Sexual Activity  . Alcohol use: Yes    Comment: weekends  . Drug use: Yes    Types: Marijuana  . Sexual activity: Not on file  Other Topics Concern  . Not on file  Social History  Narrative  . Not on file    Mr. Saladin family history includes Stroke in his mother.      Objective:    Vitals:   10/26/17 1021  BP: 106/72  Pulse: 84    Physical Exam well-developed young [place] male in no acute distress, blood pressure 106/72 pulse 84, BMI 32.9. HEENT ;nontraumatic normocephalic EOMI PERRLA sclera anicteric, Cardiovascular; regular rate and rhythm with S1-S2 no murmur or gallop, Pulmonary ;clear bilaterally, Abdomen ;soft, he's tender in the left lower and left mid quadrant also tender in the right lower quadrant there is no palpable mass or hepatosplenomegaly bowel sounds are active Rectal  ;exam not done, Extremities; no clubbing cyanosis or edema skin warm and dry, Neuropsych; mood and affect appropriate       Assessment & Plan:   #63 26 year old [place] male with 1 year history of ongoing abdominal pain described as burning and pressure, and crampy at times. This is been associated with loose stools with 2-3 early morning bowel movements and then urgency postprandially. Etiology of symptoms is unclear, will need to rule out IBD i.e. Crohn's or colitis versus possible IBS, rule out other intra-abdominal inflammatory process #2  history of H. pylori, treated August 2018 #3 urinary frequency  Plan; CBC with differential, see met, sedimentation rate, UA Schedule for CT scan of the abdomen and pelvis with contrast Discussed possible need for colonoscopy with patient depending on labs and CT scan. We will start trial of Bentyl 10 mg by mouth 3 times a day before meals. Patient will be established with Dr. Silverio Decamp.    Davarius Ridener Genia Harold PA-C 10/26/2017   Cc: No ref. provider found

## 2017-10-26 NOTE — Progress Notes (Signed)
Reviewed and agree with documentation and assessment and plan. K. Veena Jaya Lapka , MD   

## 2017-10-26 NOTE — Patient Instructions (Signed)
Your physician has requested that you go to the basement for lab work before leaving today  We have sent the following medications to your pharmacy for you to pick up at your convenience:  Bentyl  You have been scheduled for a CT scan of the abdomen and pelvis at [facility] ([address]---this is in the same building as [facility]).   You are scheduled on 11/09/2017 at 1:00pm. You should arrive 15 minutes prior to your appointment time for registration. Please follow the written instructions below on the day of your exam:  WARNING: IF YOU ARE ALLERGIC TO IODINE/X-RAY DYE, PLEASE NOTIFY RADIOLOGY IMMEDIATELY AT [phone]! YOU WILL BE GIVEN A 13 HOUR PREMEDICATION PREP.  1) Do not eat or drink anything after 9:00am(4 hours prior to your test) 2) You have been given 2 bottles of oral contrast to drink. The solution may taste               better if refrigerated, but do NOT add ice or any other liquid to this solution. Shake             well before drinking.    Drink 1 bottle of contrast @ 11:00am (2 hours prior to your exam)  Drink 1 bottle of contrast @ 12:00pm (1 hour prior to your exam)  You may take any medications as prescribed with a small amount of water except for the following: Metformin, Glucophage, Glucovance, Avandamet, Riomet, Fortamet, Actoplus Met, Janumet, Glumetza or Metaglip. The above medications must be held the day of the exam AND 48 hours after the exam.  The purpose of you drinking the oral contrast is to aid in the visualization of your intestinal tract. The contrast solution may cause some diarrhea. Before your exam is started, you will be given a small amount of fluid to drink. Depending on your individual set of symptoms, you may also receive an intravenous injection of x-ray contrast/dye. Plan on being at [facility] for 30 minutes or long, depending on the type of exam you are having performed.  If you have any questions regarding your  exam or if you need to reschedule, you may call the CT department at [phone] between the hours of 8:00 am and 5:00 pm, Monday-Friday.  ________________________________________________________________________

## 2017-11-09 ENCOUNTER — Inpatient Hospital Stay: Admission: RE | Admit: 2017-11-09 | Payer: PRIVATE HEALTH INSURANCE | Source: Ambulatory Visit

## 2017-11-11 ENCOUNTER — Inpatient Hospital Stay: Admission: RE | Admit: 2017-11-11 | Payer: PRIVATE HEALTH INSURANCE | Source: Ambulatory Visit

## 2017-11-25 ENCOUNTER — Ambulatory Visit (INDEPENDENT_AMBULATORY_CARE_PROVIDER_SITE_OTHER)
Admission: RE | Admit: 2017-11-25 | Discharge: 2017-11-25 | Disposition: A | Discharge status: Home / Self Care | Payer: PRIVATE HEALTH INSURANCE | Source: Ambulatory Visit | Attending: Physician Assistant | Admitting: Physician Assistant

## 2017-11-25 DIAGNOSIS — R1084 Generalized abdominal pain: Secondary | ICD-10-CM | POA: Diagnosis not present

## 2017-11-25 DIAGNOSIS — R109 Unspecified abdominal pain: Secondary | ICD-10-CM

## 2017-11-25 DIAGNOSIS — R195 Other fecal abnormalities: Secondary | ICD-10-CM

## 2017-11-25 MED ORDER — IOPAMIDOL (ISOVUE-300) INJECTION 61%
100.0000 mL | Freq: Once | INTRAVENOUS | Status: AC | PRN
  Administered 2017-11-25: 100 mL via INTRAVENOUS

## 2017-12-01 ENCOUNTER — Other Ambulatory Visit: Payer: Self-pay

## 2017-12-01 ENCOUNTER — Telehealth: Payer: Self-pay

## 2017-12-01 MED ORDER — DICYCLOMINE HCL 10 MG PO CAPS: 10.0000 mg | ORAL_CAPSULE | Freq: Three times a day (TID) | ORAL | 2 refills | Status: DC

## 2017-12-01 NOTE — Telephone Encounter (Signed)
-----   Message from Alfredia Ferguson, PA-C sent at 11/29/2017  9:32 AM EST ----- Please let pt know his CT scan is normal - no sign of inflammation of small bowel or colon - see how he is feeling regarding diarrhea and abdominal  pain

## 2017-12-01 NOTE — Telephone Encounter (Signed)
Spoke with Eric Sharp. He is advised of the plan. He is going [place] to work. He will not be able to do the stool studies at this time. He states now that the Bentyl may have helped a little. He would like a refill. He will contact us when he returns if he is still having the symptoms.

## 2017-12-01 NOTE — Telephone Encounter (Signed)
Lets do GI path panel, stool for lactoferrin, . He can use Imodium one every morning then up to 6 per day as needed.

## 2017-12-01 NOTE — Telephone Encounter (Signed)
Patient informed of the results. He reports no improvement in the abdominal pain and diarrhea. He was unable to tolerate the Dicyclomine. States it caused dizziness and did not relieve his symptoms.

## 2018-01-04 ENCOUNTER — Ambulatory Visit: Payer: PRIVATE HEALTH INSURANCE | Admitting: Physician Assistant

## 2018-01-04 ENCOUNTER — Other Ambulatory Visit (INDEPENDENT_AMBULATORY_CARE_PROVIDER_SITE_OTHER): Payer: PRIVATE HEALTH INSURANCE

## 2018-01-04 ENCOUNTER — Encounter: Payer: Self-pay | Admitting: Physician Assistant

## 2018-01-04 ENCOUNTER — Encounter (INDEPENDENT_AMBULATORY_CARE_PROVIDER_SITE_OTHER): Payer: Self-pay

## 2018-01-04 ENCOUNTER — Encounter: Payer: Self-pay | Admitting: Gastroenterology

## 2018-01-04 VITALS — BP 102/78 | HR 68 | Ht 63.0 in | Wt 190.0 lb

## 2018-01-04 DIAGNOSIS — R109 Unspecified abdominal pain: Secondary | ICD-10-CM

## 2018-01-04 DIAGNOSIS — R197 Diarrhea, unspecified: Secondary | ICD-10-CM

## 2018-01-04 LAB — IGA: IGA: 245 mg/dL (ref 68–378)

## 2018-01-04 MED ORDER — NA SULFATE-K SULFATE-MG SULF 17.5-3.13-1.6 GM/177ML PO SOLN: 1.0000 | Freq: Once | ORAL | 0 refills | Status: AC

## 2018-01-04 NOTE — Patient Instructions (Addendum)
Please go to the basement level to have your labs drawn. You have been scheduled for a colonoscopy. Please follow written instructions given to you at your visit today.  Please pick up your prep supplies at the pharmacy within the next 1-3 days. [place].  If you use inhalers (even only as needed), please bring them with you on the day of your procedure. Your physician has requested that you go to www.startemmi.com and enter the access code given to you at your visit today. This web site gives a general overview about your procedure. However, you should still follow specific instructions given to you by our office regarding your preparation for the procedure.  IIf you are age 63 or younger, your body mass index should be between 19-25. Your Body mass index is 33.66 kg/m. If this is out of the aformentioned range listed, please consider follow up with your Primary Care Provider.

## 2018-01-04 NOTE — Progress Notes (Signed)
Subjective:    Patient ID: Eric Sharp, male    DOB: [birth year], 27 y.o.   MRN: [id number]  HPI Eric Sharp  is a 27 year old Asian male, who was initially seen in our office by myself in November 2018, and is established with Dr. Silverio Decamp. At that time he was complaining of abdominal pain which had been present over the past year. He had been seen prior to that by urgent care, found to be H. pylori antibody positive and was treated without any improvement in his symptoms. He describes a pressure-type of sensation of burning in his lower abdomen and having 2-3 loose bowel movements and some postprandial urgency each day. Baseline labs were done including sedimentation rate and CRP both of which were normal.  He had CT of the abdomen and pelvis done which was normal. He was given a trial of dicyclomine but says he did not feel that this helped his symptoms much. He comes in today with the same complaints but states that he is now having 6-7 bowel movements per day on most days. He has some urgency postprandially and will often have several bowel movements first thing early in the morning. Up he feels that spicy foods aggravate his symptoms, he is unaware of any other definite food triggers. He is not complaining of any nausea or vomiting, appetite has been fine and weight has been stable. He has not noted any melena or hematochezia. He is not taking any medications regularly. Family history is negative for GI diseases far as he  is aware.  Review of Systems Pertinent positive and negative review of systems were noted in the above HPI section.  All other review of systems was otherwise negative.  Outpatient Encounter Medications as of 01/04/2018  Medication Sig  . dicyclomine (BENTYL) 10 MG capsule Take 1 capsule (10 mg total) by mouth 3 (three) times daily.  . Na Sulfate-K Sulfate-Mg Sulf 17.5-3.13-1.6 GM/177ML SOLN Take 1 kit by mouth once for 1 dose.   No facility-administered encounter medications on file as  of 01/04/2018.    No Known Allergies There are no active problems to display for this patient.  Social History   Socioeconomic History  . Marital status: Single    Spouse name: Not on file  . Number of children: 0  . Years of education: Not on file  . Highest education level: Not on file  Social Needs  . Financial resource strain: Not on file  . Food insecurity - worry: Not on file  . Food insecurity - inability: Not on file  . Transportation needs - medical: Not on file  . Transportation needs - non-medical: Not on file  Occupational History  . Not on file  Tobacco Use  . Smoking status: Current Some Day Smoker    Types: Cigarettes  . Smokeless tobacco: Never Used  Substance and Sexual Activity  . Alcohol use: Yes    Comment: Occ   . Drug use: No    Comment: not currently 01-04-2018  . Sexual activity: Not on file  Other Topics Concern  . Not on file  Social History Narrative  . Not on file    Eric Sharp family history includes Stroke in his mother.      Objective:    Vitals:   01/04/18 0921  BP: 102/78  Pulse: 68    Physical Exam well-developed young Asian male in no acute distress, pleasant blood pressure 102/78 pulse 68, height 5 foot 3, weight 190, BMI of 33.6.  HEENT ;nontraumatic normocephalic EOMI PERRLA sclera anicteric, Cardiovascular; regular rate and rhythm with S1-S2 no murmur or gallop, Pulmonary; clear bilaterally, Abdomen; soft, bowel sounds are present she is no palpable mass or hepatosplenomegaly, he has some mild tenderness in the left lower and left mid quadrant no guarding. Rectal; exam not done, Extremities; no clubbing cyanosis or edema skin warm and dry, Neuropsych ;mood and affect appropriate       Assessment & Plan:   #55 27 year old Asian male with at least 1 year history of abdominal discomfort, now more left-sided and frequent loose bowel movements with up to 6-7 bowel movements per day. Etiology of symptoms is not entirely clear, rule out  IBS D, rule out celiac disease, rule out IBD. Doubt infectious but will rule out giardiasis etc.  Plan; GI pathogen panel, stool for lactoferrin, TTG and IgA Will go ahead and schedule for colonoscopy with Dr. Silverio Decamp. Procedure was discussed in detail with the patient including, indications, risks and benefits, and he is agreeable to proceed. He had little response to dicyclomine, will leave off anti-spasmodic for now. Consider a course of Xifaxan if colonoscopy is negative.    Danyelle Brookover S Eric Edgley PA-C 01/04/2018   Cc: No ref. provider found

## 2018-01-05 ENCOUNTER — Other Ambulatory Visit: Payer: PRIVATE HEALTH INSURANCE

## 2018-01-05 DIAGNOSIS — R109 Unspecified abdominal pain: Secondary | ICD-10-CM

## 2018-01-05 DIAGNOSIS — R197 Diarrhea, unspecified: Secondary | ICD-10-CM

## 2018-01-06 LAB — TISSUE TRANSGLUTAMINASE, IGG: (TTG) AB, IGG: 2 U/mL

## 2018-01-07 NOTE — Progress Notes (Signed)
Reviewed and agree with documentation and assessment and plan. K. Veena Nandigam , MD   

## 2018-01-10 ENCOUNTER — Ambulatory Visit (AMBULATORY_SURGERY_CENTER): Payer: PRIVATE HEALTH INSURANCE | Admitting: Gastroenterology

## 2018-01-10 ENCOUNTER — Encounter: Payer: Self-pay | Admitting: Gastroenterology

## 2018-01-10 VITALS — BP 117/79 | HR 64 | Temp 98.6°F | Resp 10 | Ht 63.0 in | Wt 190.0 lb

## 2018-01-10 DIAGNOSIS — D128 Benign neoplasm of rectum: Secondary | ICD-10-CM | POA: Diagnosis not present

## 2018-01-10 DIAGNOSIS — R197 Diarrhea, unspecified: Secondary | ICD-10-CM

## 2018-01-10 LAB — GASTROINTESTINAL PATHOGEN PANEL PCR
C. DIFFICILE TOX A/B, PCR: NOT DETECTED
Campylobacter, PCR: NOT DETECTED
Cryptosporidium, PCR: NOT DETECTED
E COLI (ETEC) LT/ST, PCR: NOT DETECTED
E coli (STEC) stx1/stx2, PCR: NOT DETECTED
E coli 0157, PCR: NOT DETECTED
Giardia lamblia, PCR: NOT DETECTED
Norovirus, PCR: NOT DETECTED
Rotavirus A, PCR: NOT DETECTED
SALMONELLA, PCR: NOT DETECTED
SHIGELLA, PCR: NOT DETECTED

## 2018-01-10 LAB — FECAL LACTOFERRIN, QUANT
Fecal Lactoferrin: POSITIVE — AB
MICRO NUMBER:: 90160679
SPECIMEN QUALITY:: ADEQUATE

## 2018-01-10 MED ORDER — SODIUM CHLORIDE 0.9 % IV SOLN: 500.0000 mL | Freq: Once | INTRAVENOUS | Status: DC

## 2018-01-10 NOTE — Op Note (Signed)
[facility] Patient Name: Eric Sharp Procedure Date: 01/10/2018 10:27 AM MRN: [id number] Endoscopist: Mauri Pole , MD Age: 27 Referring MD:  Date of Birth: [birth year] Gender: Male Account #: [id number] Procedure:                Colonoscopy Indications:              Clinically significant diarrhea of unexplained                            origin Medicines:                Monitored Anesthesia Care Procedure:                Pre-Anesthesia Assessment:                           - Prior to the procedure, a History and Physical                            was performed, and patient medications and                            allergies were reviewed. The patient's tolerance of                            previous anesthesia was also reviewed. The risks                            and benefits of the procedure and the sedation                            options and risks were discussed with the patient.                            All questions were answered, and informed consent                            was obtained. Prior Anticoagulants: The patient has                            taken no previous anticoagulant or antiplatelet                            agents. ASA Grade Assessment: II - A patient with                            mild systemic disease. After reviewing the risks                            and benefits, the patient was deemed in                            satisfactory condition to undergo the procedure.  After obtaining informed consent, the colonoscope                            was passed under direct vision. Throughout the                            procedure, the patient's blood pressure, pulse, and                            oxygen saturations were monitored continuously. The                            Colonoscope was introduced through the anus and                            advanced to the the terminal ileum, with          identification of the appendiceal orifice and IC                            valve. The colonoscopy was performed without                            difficulty. The patient tolerated the procedure                            well. The quality of the bowel preparation was                            excellent. The terminal ileum, ileocecal valve,                            appendiceal orifice, and rectum were photographed. Scope In: 10:32:41 AM Scope Out: 10:44:35 AM Scope Withdrawal Time: 0 hours 10 minutes 37 seconds  Total Procedure Duration: 0 hours 11 minutes 54 seconds  Findings:                 The perianal and digital rectal examinations were                            normal.                           Normal mucosa was found in the entire colon.                            Biopsies for histology were taken with a cold                            forceps from the right colon and left colon for                            evaluation of microscopic colitis.  The terminal ileum appeared normal. Biopsies were                            taken with a cold forceps for histology.                           A 4 mm polyp was found in the rectum. The polyp was                            sessile. The polyp was removed with a cold snare.                            Resection and retrieval were complete.                           The exam was otherwise without abnormality. Complications:            No immediate complications. Estimated Blood Loss:     Estimated blood loss was minimal. Impression:               - Normal mucosa in the entire examined colon.                            Biopsied.                           - The examined portion of the ileum was normal.                            Biopsied.                           - One 4 mm polyp in the rectum, removed with a cold                            snare. Resected and retrieved.                           - The  examination was otherwise normal. Recommendation:           - Patient has a contact number available for                            emergencies. The signs and symptoms of potential                            delayed complications were discussed with the                            patient. Return to normal activities tomorrow.                            Written discharge instructions were provided to the  patient.                           - Resume previous diet.                           - Continue present medications.                           - Await pathology results.                           - Repeat colonoscopy date to be determined after                            pending pathology results are reviewed for                            surveillance based on pathology results.                           - Return to GI clinic in 1 month. Mauri Pole, MD 01/10/2018 10:49:18 AM This report has been signed electronically.

## 2018-01-10 NOTE — Progress Notes (Signed)
Pt's states no medical or surgical changes since previsit or office visit. 

## 2018-01-10 NOTE — Progress Notes (Signed)
Called to room to assist during endoscopic procedure.  Patient ID and intended procedure confirmed with present staff. Received instructions for my participation in the procedure from the performing physician.  

## 2018-01-10 NOTE — Patient Instructions (Signed)
**   Handouts given on polyps. Make appt to return to GI clinic in one month **  YOU HAD AN ENDOSCOPIC PROCEDURE TODAY AT [facility]:   Refer to the procedure report that was given to you for any specific questions about what was found during the examination.  If the procedure report does not answer your questions, please call your gastroenterologist to clarify.  If you requested that your care partner not be given the details of your procedure findings, then the procedure report has been included in a sealed envelope for you to review at your convenience later.  YOU SHOULD EXPECT: Some feelings of bloating in the abdomen. Passage of more gas than usual.  Walking can help get rid of the air that was put into your GI tract during the procedure and reduce the bloating. If you had a lower endoscopy (such as a colonoscopy or flexible sigmoidoscopy) you may notice spotting of blood in your stool or on the toilet paper. If you underwent a bowel prep for your procedure, you may not have a normal bowel movement for a few days.  Please Note:  You might notice some irritation and congestion in your nose or some drainage.  This is from the oxygen used during your procedure.  There is no need for concern and it should clear up in a day or so.  SYMPTOMS TO REPORT IMMEDIATELY:   Following lower endoscopy (colonoscopy or flexible sigmoidoscopy):  Excessive amounts of blood in the stool  Significant tenderness or worsening of abdominal pains  Swelling of the abdomen that is new, acute  Fever of 100F or higher  For urgent or emergent issues, a gastroenterologist can be reached at any hour by calling [phone].   DIET:  We do recommend a small meal at first, but then you may proceed to your regular diet.  Drink plenty of fluids but you should avoid alcoholic beverages for 24 hours.  ACTIVITY:  You should plan to take it easy for the rest of today and you should NOT DRIVE or use heavy  machinery until tomorrow (because of the sedation medicines used during the test).    FOLLOW UP: Our staff will call the number listed on your records the next business day following your procedure to check on you and address any questions or concerns that you may have regarding the information given to you following your procedure. If we do not reach you, we will leave a message.  However, if you are feeling well and you are not experiencing any problems, there is no need to return our call.  We will assume that you have returned to your regular daily activities without incident.  If any biopsies were taken you will be contacted by phone or by letter within the next 1-3 weeks.  Please call us at [phone] if you have not heard about the biopsies in 3 weeks.    SIGNATURES/CONFIDENTIALITY: You and/or your care partner have signed paperwork which will be entered into your electronic medical record.  These signatures attest to the fact that that the information above on your After Visit Summary has been reviewed and is understood.  Full responsibility of the confidentiality of this discharge information lies with you and/or your care-partner.

## 2018-01-10 NOTE — Progress Notes (Signed)
Report to PACU, RN, vss, BBS= Clear.  

## 2018-01-11 ENCOUNTER — Telehealth: Payer: Self-pay

## 2018-01-11 NOTE — Telephone Encounter (Signed)
  Follow up Call-  Call back number 01/10/2018  Post procedure Call Back phone  # [phone]  Permission to leave phone message Yes  Some recent data might be hidden     Patient questions:  Do you have a fever, pain , or abdominal swelling? No. Pain Score  0 *  Have you tolerated food without any problems? Yes.    Have you been able to return to your normal activities? Yes.    Do you have any questions about your discharge instructions: Diet   No. Medications  No. Follow up visit  No.  Do you have questions or concerns about your Care? No.  Actions: * If pain score is 4 or above: No action needed, pain <4.

## 2018-01-12 ENCOUNTER — Other Ambulatory Visit: Payer: Self-pay

## 2018-01-14 ENCOUNTER — Telehealth: Payer: Self-pay

## 2018-01-14 ENCOUNTER — Other Ambulatory Visit: Payer: Self-pay

## 2018-01-14 MED ORDER — BUDESONIDE 3 MG PO CPEP
9.0000 mg | ORAL_CAPSULE | Freq: Every day | ORAL | 0 refills | Status: DC
Start: 2018-01-14 — End: 2018-01-26

## 2018-01-14 NOTE — Telephone Encounter (Signed)
Patient needs to reschedule his appointment. He will be out of this [place] in [place].

## 2018-01-14 NOTE — Telephone Encounter (Signed)
Appointment moved to [employer]. Okay per Dr Silverio Decamp.

## 2018-01-14 NOTE — Telephone Encounter (Signed)
-----   Message from Mauri Pole, MD sent at 01/14/2018 11:58 AM EST ----- He had mild increase in intraepithelial lymphocytes, though not enough to be diagnostic of chronic lymphocytic colitis will treat with Budesonide 9 mg daily X 30 days given persistent chronic diarrhea. GI pathogen panel negative 01/05/18. Follow up in office visit in 1 month. Polyp removed from rectum was tubular adenoma, recall colonoscopy in 5 years for surveillance. Please inform patient the results. Thanks

## 2018-01-21 ENCOUNTER — Telehealth: Payer: Self-pay | Admitting: Gastroenterology

## 2018-01-21 NOTE — Telephone Encounter (Signed)
No answer. Voicemail is not set up. Cannot leave a message.

## 2018-01-25 NOTE — Telephone Encounter (Signed)
Spoke with the patient. He says he is maybe a little better. Confirmed the medication with the patient. He will continue and keep in touch with [place].

## 2018-01-26 ENCOUNTER — Encounter: Payer: Self-pay | Admitting: Physician Assistant

## 2018-01-26 ENCOUNTER — Ambulatory Visit: Payer: PRIVATE HEALTH INSURANCE | Admitting: Physician Assistant

## 2018-01-26 VITALS — BP 104/70 | HR 78 | Ht 63.0 in | Wt 192.0 lb

## 2018-01-26 DIAGNOSIS — K52832 Lymphocytic colitis: Secondary | ICD-10-CM

## 2018-01-26 DIAGNOSIS — R197 Diarrhea, unspecified: Secondary | ICD-10-CM

## 2018-01-26 MED ORDER — BUDESONIDE 3 MG PO CPEP
ORAL_CAPSULE | ORAL | 3 refills | Status: DC
Start: 2018-01-26 — End: 2019-02-06

## 2018-01-26 MED ORDER — DICYCLOMINE HCL 10 MG PO CAPS: 10.0000 mg | ORAL_CAPSULE | Freq: Three times a day (TID) | ORAL | 1 refills | Status: DC

## 2018-01-26 NOTE — Progress Notes (Signed)
Subjective:    Patient ID: Eric Sharp, male    DOB: 09-Apr-1991, 27 y.o.   MRN: [id number]  HPI  Neizan is a 27 year old Asian male, recently known to Dr. Silverio Decamp and myself. He had office visit on 01/04/2018 with persistent complaints of 1 year history of abdominal discomfort primarily left sided and diarrhea with 6-7 bowel movements per day. Labs were done and unremarkable CBC, sedimentation rate and CRP, negative TTG and IgA. GI pathogen panel was negative, fecal lactoferrin was positive. He was started on Bentyl 3 times daily some mild improvement in symptoms. Patient underwent colonoscopy on 01/10/2018 for Dr. Silverio Decamp with normal-appearing colonic mucosa, TIA unremarkable. He did have a 4 mm sessile rectal polyp which was removed and biopsy  showed this to be a tubular adenoma. Colon biopsies were also done and returned showing increase in intraepithelial lymphocytes not definitely diagnostic of lymphocytic colitis but suggestive. He started budesonide 9 mg per day about 2 weeks ago. He comes back in today for follow-up. He is still having some postprandial urgency, and stools are loose but not liquid and he is now having about 2-3 bowel movements per day. Appetite has been fine and weight has been stable. He denies any abdominal pain.  Review of Systems Pertinent positive and negative review of systems were noted in the above HPI section.  All other review of systems was otherwise negative.  Outpatient Encounter Medications as of 01/26/2018  Medication Sig  . budesonide (ENTOCORT EC) 3 MG 24 hr capsule Take 3 pills every morning.  . dicyclomine (BENTYL) 10 MG capsule Take 1 capsule (10 mg total) by mouth 3 (three) times daily.  . [DISCONTINUED] budesonide (ENTOCORT EC) 3 MG 24 hr capsule Take 3 capsules (9 mg total) by mouth daily.  . [DISCONTINUED] dicyclomine (BENTYL) 10 MG capsule Take 1 capsule (10 mg total) by mouth 3 (three) times daily.   Facility-Administered Encounter Medications as  of 01/26/2018  Medication  . 0.9 %  sodium chloride infusion   No Known Allergies There are no active problems to display for this patient.  Social History   Socioeconomic History  . Marital status: Single    Spouse name: Not on file  . Number of children: 0  . Years of education: Not on file  . Highest education level: Not on file  Social Needs  . Financial resource strain: Not on file  . Food insecurity - worry: Not on file  . Food insecurity - inability: Not on file  . Transportation needs - medical: Not on file  . Transportation needs - non-medical: Not on file  Occupational History  . Occupation: roofing  Tobacco Use  . Smoking status: Current Some Day Smoker    Types: Cigarettes  . Smokeless tobacco: Never Used  Substance and Sexual Activity  . Alcohol use: Yes    Comment: Occ   . Drug use: No    Comment: not currently 01-04-2018  . Sexual activity: Not on file  Other Topics Concern  . Not on file  Social History Narrative  . Not on file    Mr. Damore family history includes Stroke in his mother.      Objective:    Vitals:   01/26/18 1525  BP: 104/70  Pulse: 78    Physical Exam well-developed young Asian male in no acute distress, pleasant blood pressure 104/70 pulse 78, height 5 foot 3, weight 192, BMI of 34. HEENT; nontraumatic normocephalic EOMI PERRLA sclera anicteric, Cardiovascular; regular rate and  rhythm with S1-S2 no murmur or gallop, Pulmonary ;clear bilaterally, Abdomen ;soft, nontender nondistended bowel sounds are active no palpable mass or hepatosplenomegaly, Extremities; no clubbing cyanosis or edema skin warm and dry, Neuropsych; mood and affect appropriate       Assessment & Plan:   #60 27 year old Asian male with new diagnosis of probable lymphocytic colitis. He appears to be responding to budesonide 9 mg by mouth daily with decrease in stool frequency and improved consistency  #2 tubular adenomatous colon polyp-will need follow-up  colonoscopy in 5 years/2024  Plan; discussion with patient today regarding his diagnosis, and management. There is some language barrier. Diet as tolerated, avoid any foods that trigger cramping or diarrhea Continue budesonide 3 mg tablets, 9 mg each morning daily through April 1, then decrease to 6 mg by mouth every morning until next office visit. He can continue dicyclomine 10 mg by mouth 3 times a day on an as-needed basis and was advised to try to wean off of this He will follow-up with Dr. Silverio Decamp or myself in the office in 2 months, appointment to be scheduled today. He is aware that he should call should he have any problems in the interim. We also discussed limiting alcohol intake, and avoidance of aspirin and NSAIDs, he is not on any other regular medications   Amy S Esterwood PA-C 01/26/2018   Cc: No ref. provider found

## 2018-01-26 NOTE — Patient Instructions (Signed)
Stay on the Buddesonide 3 pills every morning.  On April 1st, decrease to 2 pills daily.  Stay on Bentyl ( Dicyclomine) 10 mg- take 1 pill 3 times daily for cramping. You can try coming off this medicine.   Take no aspirin or motrin, Ibuprofen, Advil, Aleve.   We made you an appointment with Dr. Silverio Decamp on 04-12-2018 at 3:00 PM.  Please call us if you need to be seen sooner.

## 2018-01-27 NOTE — Progress Notes (Signed)
Reviewed and agree with documentation and assessment and plan. K. Veena Nandigam , MD   

## 2018-02-21 ENCOUNTER — Ambulatory Visit: Payer: PRIVATE HEALTH INSURANCE | Admitting: Gastroenterology

## 2018-04-12 ENCOUNTER — Ambulatory Visit: Payer: PRIVATE HEALTH INSURANCE | Admitting: Gastroenterology

## 2018-07-19 ENCOUNTER — Encounter (HOSPITAL_COMMUNITY): Payer: Self-pay | Admitting: Emergency Medicine

## 2018-07-19 ENCOUNTER — Ambulatory Visit (HOSPITAL_COMMUNITY)
Admission: EM | Admit: 2018-07-19 | Discharge: 2018-07-19 | Disposition: A | Discharge status: Home / Self Care | Payer: PRIVATE HEALTH INSURANCE | Attending: Physician Assistant | Admitting: Physician Assistant

## 2018-07-19 DIAGNOSIS — H66001 Acute suppurative otitis media without spontaneous rupture of ear drum, right ear: Secondary | ICD-10-CM | POA: Diagnosis not present

## 2018-07-19 MED ORDER — FLUTICASONE PROPIONATE 50 MCG/ACT NA SUSP: 2.0000 | Freq: Every day | NASAL | 0 refills | Status: DC

## 2018-07-19 MED ORDER — AMOXICILLIN 875 MG PO TABS: 875.0000 mg | ORAL_TABLET | Freq: Two times a day (BID) | ORAL | 0 refills | Status: DC

## 2018-07-19 NOTE — Discharge Instructions (Signed)
Start amoxicillin as directed for ear infection. Start flonase as directed to help with possible eustachian tube dysfunction. Tylenol/motrin for pain. Follow up with PCP if symptoms not improving.

## 2018-07-19 NOTE — ED Triage Notes (Signed)
PT reports decreased hearing in right ear.

## 2018-07-19 NOTE — ED Provider Notes (Signed)
[facility]    CSN: [id number] Arrival date & time: 07/19/18  1403     History   Chief Complaint Chief Complaint  Patient presents with  . Ear Fullness    HPI Eric Sharp is a 27 y.o. male.   27 year old male comes in for 1 week history of right ear fullness. Denies obvious pain. Does have some ear drainage, especially when using cotton swab. Denies URI symptoms such as cough, congestion, sore throat. Denies fever, chills, night sweats. Denies decrease in hearing. Has not been doing anything for it.      Past Medical History:  Diagnosis Date  . Microscopic colitis   . Tubular adenoma of colon     There are no active problems to display for this patient.   Past Surgical History:  Procedure Laterality Date  . COLONOSCOPY W/ BIOPSIES    . EXTERNAL EAR SURGERY Right        Home Medications    Prior to Admission medications   Medication Sig Start Date End Date Taking? Authorizing Provider  amoxicillin (AMOXIL) 875 MG tablet Take 1 tablet (875 mg total) by mouth 2 (two) times daily. 07/19/18   Tasia Catchings, Esly Selvage V, PA-C  budesonide (ENTOCORT EC) 3 MG 24 hr capsule Take 3 pills every morning. 01/26/18   Esterwood, Fatoumata Albaugh S, PA-C  dicyclomine (BENTYL) 10 MG capsule Take 1 capsule (10 mg total) by mouth 3 (three) times daily. 01/26/18   Esterwood, Makiya Jeune S, PA-C  fluticasone (FLONASE) 50 MCG/ACT nasal spray Place 2 sprays into both nostrils daily. 07/19/18   Ok Edwards, PA-C    Family History Family History  Problem Relation Age of Onset  . Stroke Mother   . Colon cancer Neg Hx   . Stomach cancer Neg Hx     Social History Social History   Tobacco Use  . Smoking status: Current Some Day Smoker    Types: Cigarettes  . Smokeless tobacco: Never Used  Substance Use Topics  . Alcohol use: Yes    Comment: Occ   . Drug use: No    Types: Marijuana    Comment: not currently 01-04-2018     Allergies   Patient has no known allergies.   Review of Systems Review of Systems    Reason unable to perform ROS: See HPI as above.     Physical Exam Triage Vital Signs ED Triage Vitals  Enc Vitals Group     BP 07/19/18 1434 128/71     Pulse Rate 07/19/18 1434 64     Resp 07/19/18 1434 16     Temp 07/19/18 1434 98.1 F (36.7 C)     Temp Source 07/19/18 1434 Oral     SpO2 07/19/18 1434 100 %     Weight --      Height --      Head Circumference --      Peak Flow --      Pain Score 07/19/18 1436 0     Pain Loc --      Pain Edu? --      Excl. in [place]? --    No data found.  Updated Vital Signs BP 128/71 (BP Location: Right Arm)   Pulse 64   Temp 98.1 F (36.7 C) (Oral)   Resp 16   SpO2 100%   Physical Exam  Constitutional: He is oriented to person, place, and time. He appears well-developed and well-nourished. No distress.  HENT:  Head: Normocephalic and atraumatic.  Right Ear: External ear and ear canal normal. Tympanic membrane is erythematous and bulging.  Left Ear: Tympanic membrane, external ear and ear canal normal. Tympanic membrane is not erythematous and not bulging.  Nose: Nose normal. Right sinus exhibits no maxillary sinus tenderness and no frontal sinus tenderness. Left sinus exhibits no maxillary sinus tenderness and no frontal sinus tenderness.  Mouth/Throat: Uvula is midline, oropharynx is clear and moist and mucous membranes are normal.  No tenderness to palpation of tragus.   Eyes: Pupils are equal, round, and reactive to light. Conjunctivae are normal.  Neck: Normal range of motion. Neck supple.  Cardiovascular: Normal rate, regular rhythm and normal heart sounds. Exam reveals no gallop and no friction rub.  No murmur heard. Pulmonary/Chest: Effort normal and breath sounds normal. He has no decreased breath sounds. He has no wheezes. He has no rhonchi. He has no rales.  Lymphadenopathy:    He has no cervical adenopathy.  Neurological: He is alert and oriented to person, place, and time.  Skin: Skin is warm and dry.  Psychiatric: He  has a normal mood and affect. His behavior is normal. Judgment normal.     UC Treatments / Results  Labs (all labs ordered are listed, but only abnormal results are displayed) Labs Reviewed - No data to display  EKG None  Radiology No results found.  Procedures Procedures (including critical care time)  Medications Ordered in UC Medications - No data to display  Initial Impression / Assessment and Plan / UC Course  I have reviewed the triage vital signs and the nursing notes.  Pertinent labs & imaging results that were available during my care of the patient were reviewed by me and considered in my medical decision making (see chart for details).    Will treat for otitis media with amoxicillin. flonase for possible eustachian tube dysfunction. Return precautions given. Patient expresses understanding and agrees to plan.  Final Clinical Impressions(s) / UC Diagnoses   Final diagnoses:  Non-recurrent acute suppurative otitis media of right ear without spontaneous rupture of tympanic membrane    ED Prescriptions    Medication Sig Dispense Auth. Provider   amoxicillin (AMOXIL) 875 MG tablet Take 1 tablet (875 mg total) by mouth 2 (two) times daily. 14 tablet Shivali Quackenbush V, PA-C   fluticasone (FLONASE) 50 MCG/ACT nasal spray Place 2 sprays into both nostrils daily. 1 g Tobin Chad, [place] 07/19/18 1445

## 2018-09-02 ENCOUNTER — Other Ambulatory Visit: Payer: Self-pay

## 2018-09-02 ENCOUNTER — Ambulatory Visit (HOSPITAL_COMMUNITY)
Admission: EM | Admit: 2018-09-02 | Discharge: 2018-09-02 | Disposition: A | Discharge status: Home / Self Care | Payer: PRIVATE HEALTH INSURANCE | Attending: Family Medicine | Admitting: Family Medicine

## 2018-09-02 ENCOUNTER — Encounter (HOSPITAL_COMMUNITY): Payer: Self-pay | Admitting: Emergency Medicine

## 2018-09-02 DIAGNOSIS — H60391 Other infective otitis externa, right ear: Secondary | ICD-10-CM

## 2018-09-02 MED ORDER — NEOMYCIN-POLYMYXIN-HC 3.5-10000-1 OT SUSP: 4.0000 [drp] | Freq: Three times a day (TID) | OTIC | 1 refills | Status: DC

## 2018-09-02 NOTE — ED Provider Notes (Signed)
[facility]    CSN: [id number] Arrival date & time: 09/02/18  1616     History   Chief Complaint Chief Complaint  Patient presents with  . Ear Drainage    right    HPI Eric Sharp is a 27 y.o. male.   Pt reports right ear pain x6 weeks.  Pt was seen here on 06/2018 and prescribed medication, but he states he never took it.  Now he reports ear drainage and bleeding.     Past Medical History:  Diagnosis Date  . Microscopic colitis   . Tubular adenoma of colon     There are no active problems to display for this patient.   Past Surgical History:  Procedure Laterality Date  . COLONOSCOPY W/ BIOPSIES    . EXTERNAL EAR SURGERY Right        Home Medications    Prior to Admission medications   Medication Sig Start Date End Date Taking? Authorizing Provider  budesonide (ENTOCORT EC) 3 MG 24 hr capsule Take 3 pills every morning. 01/26/18   Esterwood, Amy S, PA-C  dicyclomine (BENTYL) 10 MG capsule Take 1 capsule (10 mg total) by mouth 3 (three) times daily. 01/26/18   Esterwood, Amy S, PA-C  neomycin-polymyxin-hydrocortisone (CORTISPORIN) 3.5-10000-1 OTIC suspension Place 4 drops into the right ear 3 (three) times daily. 09/02/18   Robyn Haber, MD    Family History Family History  Problem Relation Age of Onset  . Stroke Mother   . Colon cancer Neg Hx   . Stomach cancer Neg Hx     Social History Social History   Tobacco Use  . Smoking status: Current Some Day Smoker    Types: Cigarettes  . Smokeless tobacco: Never Used  Substance Use Topics  . Alcohol use: Yes    Comment: Occ   . Drug use: No    Types: Marijuana    Comment: not currently 01-04-2018     Allergies   Patient has no known allergies.   Review of Systems Review of Systems   Physical Exam Triage Vital Signs ED Triage Vitals [09/02/18 1632]  Enc Vitals Group     BP 126/72     Pulse Rate 63     Resp      Temp 98.1 F (36.7 C)     Temp Source Oral     SpO2 100 %   Weight      Height      Head Circumference      Peak Flow      Pain Score 0     Pain Loc      Pain Edu?      Excl. in [employer]?    No data found.  Updated Vital Signs BP 126/72 (BP Location: Left Arm)   Pulse 63   Temp 98.1 F (36.7 C) (Oral)   SpO2 100%    Physical Exam  Constitutional: He is oriented to person, place, and time. He appears well-developed and well-nourished.  HENT:  Head: Normocephalic.  Purulent material in right ear canal with normal TM  Eyes: Pupils are equal, round, and reactive to light. Conjunctivae are normal.  Neck: Normal range of motion. Neck supple.  Pulmonary/Chest: Effort normal.  Musculoskeletal: Normal range of motion.  Neurological: He is alert and oriented to person, place, and time.  Skin: Skin is warm and dry.  Nursing note and vitals reviewed.    UC Treatments / Results  Labs (all labs ordered are listed, but only abnormal  results are displayed) Labs Reviewed - No data to display  EKG None  Radiology No results found.  Procedures Procedures (including critical care time)  Medications Ordered in UC Medications - No data to display  Initial Impression / Assessment and Plan / UC Course  I have reviewed the triage vital signs and the nursing notes.  Pertinent labs & imaging results that were available during my care of the patient were reviewed by me and considered in my medical decision making (see chart for details).     Final Clinical Impressions(s) / UC Diagnoses   Final diagnoses:  Infective otitis externa of right ear   Discharge Instructions   None    ED Prescriptions    Medication Sig Dispense Auth. Provider   neomycin-polymyxin-hydrocortisone (CORTISPORIN) 3.5-10000-1 OTIC suspension Place 4 drops into the right ear 3 (three) times daily. 10 mL Robyn Haber, MD     Controlled Substance Prescriptions [place] Controlled Substance Registry consulted? Not Applicable   Robyn Haber, MD 09/02/18 [phone]

## 2018-09-02 NOTE — ED Triage Notes (Signed)
Pt reports right ear pain x6 weeks.  Pt was seen here on 06/2018 and prescribed medication, but he states he never took it.  Now he reports ear drainage and bleeding.

## 2019-02-06 ENCOUNTER — Ambulatory Visit (HOSPITAL_COMMUNITY)
Admission: EM | Admit: 2019-02-06 | Discharge: 2019-02-06 | Disposition: A | Discharge status: Home / Self Care | Payer: No Typology Code available for payment source | Attending: Family Medicine | Admitting: Family Medicine

## 2019-02-06 ENCOUNTER — Encounter (HOSPITAL_COMMUNITY): Payer: Self-pay

## 2019-02-06 ENCOUNTER — Other Ambulatory Visit: Payer: Self-pay

## 2019-02-06 DIAGNOSIS — N3281 Overactive bladder: Secondary | ICD-10-CM | POA: Insufficient documentation

## 2019-02-06 DIAGNOSIS — R3589 Other polyuria: Secondary | ICD-10-CM

## 2019-02-06 DIAGNOSIS — R0789 Other chest pain: Secondary | ICD-10-CM | POA: Insufficient documentation

## 2019-02-06 DIAGNOSIS — R358 Other polyuria: Secondary | ICD-10-CM | POA: Diagnosis present

## 2019-02-06 DIAGNOSIS — E739 Lactose intolerance, unspecified: Secondary | ICD-10-CM | POA: Diagnosis not present

## 2019-02-06 LAB — POCT URINALYSIS DIP (DEVICE)
Bilirubin Urine: NEGATIVE
GLUCOSE, UA: NEGATIVE mg/dL
Hgb urine dipstick: NEGATIVE
Ketones, ur: NEGATIVE mg/dL
Leukocytes,Ua: NEGATIVE
Nitrite: NEGATIVE
PROTEIN: NEGATIVE mg/dL
SPECIFIC GRAVITY, URINE: 1.025 (ref 1.005–1.030)
Urobilinogen, UA: 0.2 mg/dL (ref 0.0–1.0)
pH: 6 (ref 5.0–8.0)

## 2019-02-06 MED ORDER — OXYBUTYNIN CHLORIDE 5 MG PO TABS: 5.0000 mg | ORAL_TABLET | Freq: Three times a day (TID) | ORAL | 1 refills | Status: AC | PRN

## 2019-02-06 MED ORDER — DICLOFENAC SODIUM 75 MG PO TBEC: 75.0000 mg | DELAYED_RELEASE_TABLET | Freq: Two times a day (BID) | ORAL | 0 refills | Status: AC

## 2019-02-06 NOTE — Discharge Instructions (Addendum)
Take Lactase with meals to help digestion.

## 2019-02-06 NOTE — ED Triage Notes (Signed)
Pt has been voiding frequently pt thinks he has a bladder problem. Pt states this has been going on for 3 years. Pt states he has been seeing different provider for this issue.

## 2019-02-06 NOTE — ED Provider Notes (Signed)
[place]    CSN: [id number] Arrival date & time: 02/06/19  1737     History   Chief Complaint Chief Complaint  Patient presents with  . Urinary Tract Infection    HPI Eric Sharp is a 28 y.o. male.   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  He had a colonoscopy a year ago which was unrevealing.  He did have a lactoferrin in his stool suggesting that he has lactose intolerance.  Patient was never given the results of these tests and has been eating ice cream and other milk products.  He does not have diarrhea at night but does have diarrhea after eating almost anything.  He does have polyuria and nocturia.  He is getting up every hour or so to pee.  Bentyl has not helped him.  For the last few days the patient has had some sharp pains in his chest when he twists his torso to the left.  He has had no shortness of breath or aching in his chest.  He has had no recent injury.  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     Past Medical History:  Diagnosis Date  . Microscopic colitis   . Tubular adenoma of colon     There are no active problems to display for this patient.   Past Surgical History:  Procedure Laterality Date  . COLONOSCOPY W/ BIOPSIES    . EXTERNAL EAR SURGERY Right        Home Medications    Prior to Admission medications   Medication Sig Start Date End Date Taking? Authorizing Provider  diclofenac (VOLTAREN) 75 MG EC tablet Take 1 tablet (75 mg total) by mouth 2 (two) times daily. 02/06/19   Robyn Haber, MD  oxybutynin (DITROPAN) 5 MG tablet Take 1 tablet (5 mg total) by mouth every 8 (eight) hours as needed for bladder spasms. 02/06/19   Robyn Haber, MD    Family History Family History  Problem Relation Age of Onset  . Stroke Mother   . Colon cancer Neg Hx   . Stomach cancer Neg Hx     Social History Social History   Tobacco Use  . Smoking status: Current Some Day  Smoker    Types: Cigarettes  . Smokeless tobacco: Never Used  Substance Use Topics  . Alcohol use: Yes    Comment: Occ   . Drug use: No    Types: Marijuana    Comment: not currently 01-04-2018     Allergies   Patient has no known allergies.   Review of Systems Review of Systems   Physical Exam Triage Vital Signs ED Triage Vitals  Enc Vitals Group     BP 02/06/19 1806 (!) 145/78     Pulse Rate 02/06/19 1806 77     Resp --      Temp 02/06/19 1806 97.8 F (36.6 C)     Temp src --      SpO2 02/06/19 1806 100 %     Weight --      Height --      Head Circumference --      Peak Flow --      Pain Score 02/06/19 1808 8     Pain Loc --      Pain Edu? --      Excl. in [place]? --    No data found.  Updated Vital Signs BP (!) 145/78 (  BP Location: Right Arm)   Pulse 77   Temp 97.8 F (36.6 C)   SpO2 100%    Physical Exam Vitals signs and nursing note reviewed.  Constitutional:      Appearance: Normal appearance.  HENT:     Head: Normocephalic and atraumatic.     Mouth/Throat:     Mouth: Mucous membranes are moist.  Eyes:     Conjunctiva/sclera: Conjunctivae normal.     Pupils: Pupils are equal, round, and reactive to light.  Neck:     Musculoskeletal: Normal range of motion and neck supple.  Cardiovascular:     Rate and Rhythm: Normal rate and regular rhythm.     Heart sounds: Normal heart sounds.  Pulmonary:     Effort: Pulmonary effort is normal.     Breath sounds: Normal breath sounds.  Abdominal:     General: Abdomen is flat. There is no distension.     Palpations: Abdomen is soft. There is no mass.     Tenderness: There is no abdominal tenderness. There is no right CVA tenderness, left CVA tenderness, guarding or rebound.  Musculoskeletal: Normal range of motion.  Skin:    General: Skin is warm.  Neurological:     General: No focal deficit present.     Mental Status: He is alert.  Psychiatric:        Mood and Affect: Mood normal.      UC  Treatments / Results  Labs (all labs ordered are listed, but only abnormal results are displayed) Labs Reviewed  URINE CULTURE  POCT URINALYSIS DIP (DEVICE)     Medications - No data to display  Initial Impression / Assessment and Plan / UC Course  I have reviewed the triage vital signs and the nursing notes.  Pertinent labs & imaging results that were available during my care of the patient were reviewed by me and considered in my medical decision making (see chart for details).    Final Clinical Impressions(s) / UC Diagnoses   Final diagnoses:  Polyuria  Lactose intolerance  Overactive bladder  Atypical chest pain     Discharge Instructions     Take Lactase with meals to help digestion.    ED Prescriptions    Medication Sig Dispense Auth. Provider   oxybutynin (DITROPAN) 5 MG tablet Take 1 tablet (5 mg total) by mouth every 8 (eight) hours as needed for bladder spasms. 90 tablet Robyn Haber, MD   diclofenac (VOLTAREN) 75 MG EC tablet Take 1 tablet (75 mg total) by mouth 2 (two) times daily. 14 tablet Robyn Haber, MD     Controlled Substance Prescriptions [place] Controlled Substance Registry consulted? Not Applicable   Robyn Haber, MD 02/06/19 [phone]

## 2019-02-08 LAB — URINE CULTURE: Culture: NO GROWTH

## 2019-02-17 IMAGING — CT CT ABD-PELV W/ CM
2 of 4 series · 16 of 46 positions shown, 18 images · IV contrast (ISOVUE 300)
Comparison: None.

CLINICAL DATA: Lower abdominal pain with diarrhea for 1 year

EXAM:
CT ABDOMEN AND PELVIS WITH CONTRAST
TECHNIQUE: Multidetector CT imaging of the abdomen and pelvis was performed
using the standard protocol following bolus administration of
intravenous contrast.
CONTRAST:  100mL 6SYRGQ-144 IOPAMIDOL (6SYRGQ-144) INJECTION 61%

[Series 2: abd/pel w · axial · 0.86mm/px · z∈[-740,-325]mm · 13 of 91 slices shown, 15 images]
[im 4/91  soft-tissue]
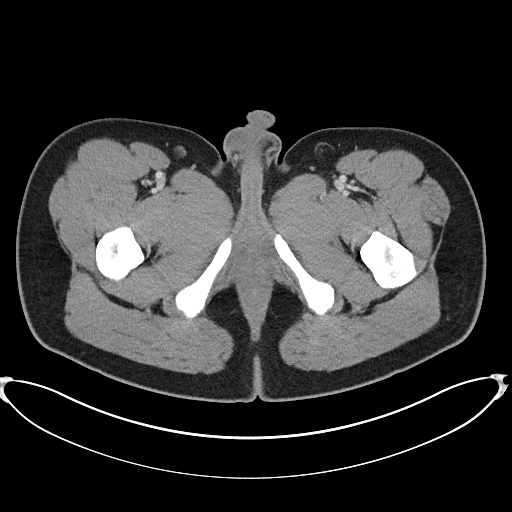
[im 4/91  bone]
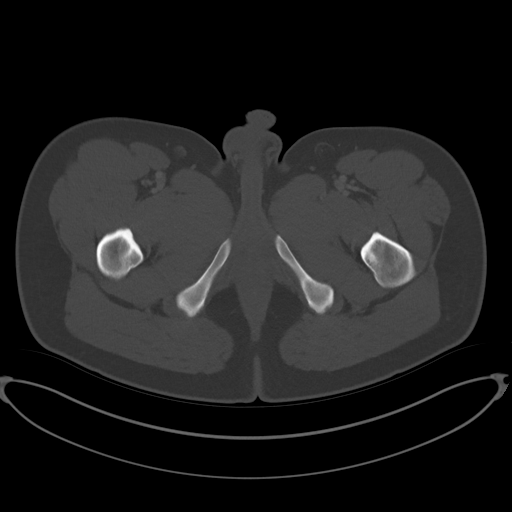
[im 11/91  soft-tissue]
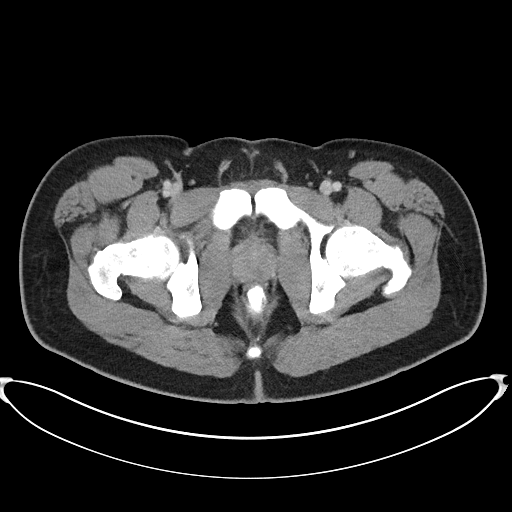
[im 19/91  soft-tissue]
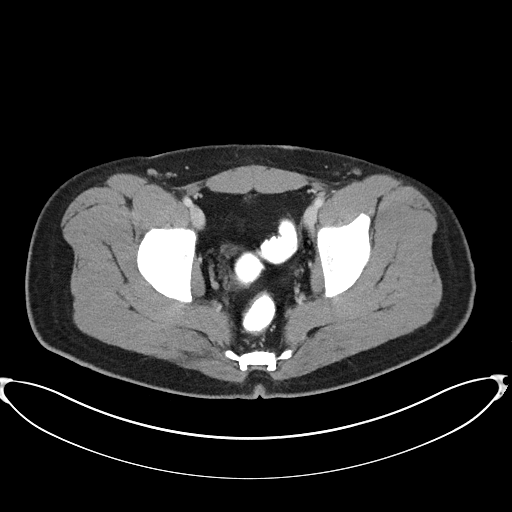
[im 26/91  soft-tissue]
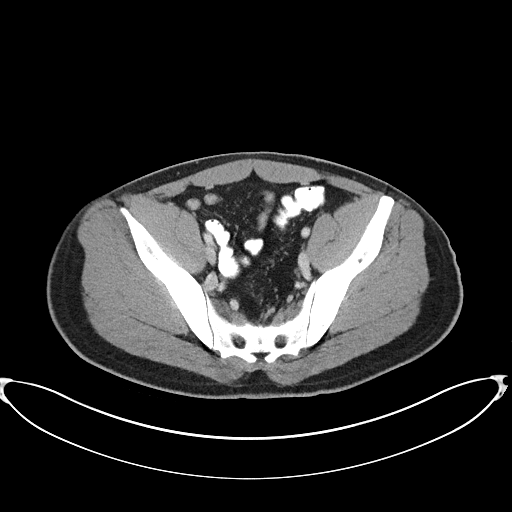
[im 33/91  soft-tissue]
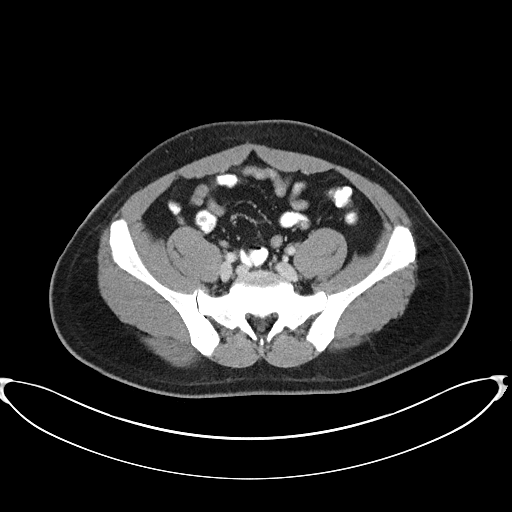
[im 40/91  soft-tissue]
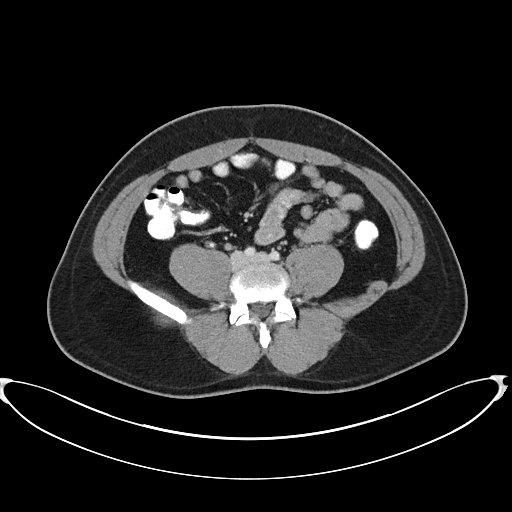
[im 47/91  soft-tissue]
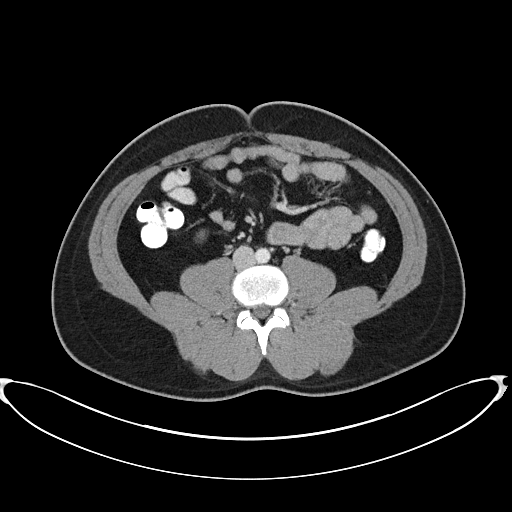
[im 51/91  soft-tissue]
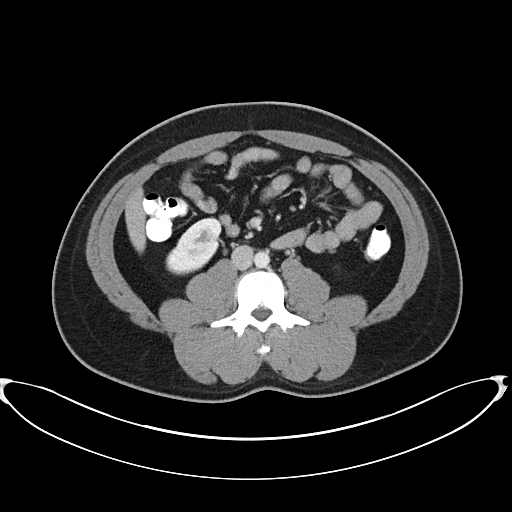
[im 58/91  soft-tissue]
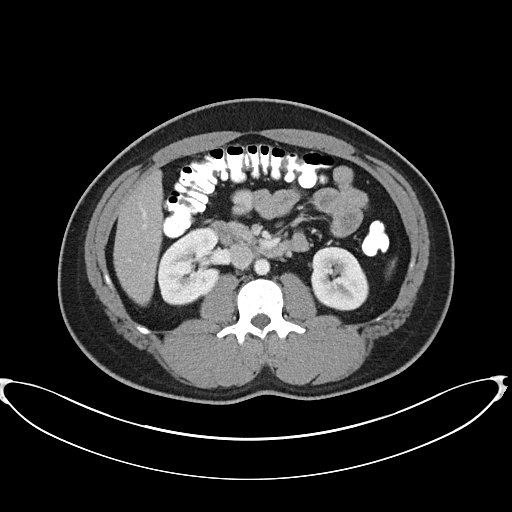
[im 58/91  bone]
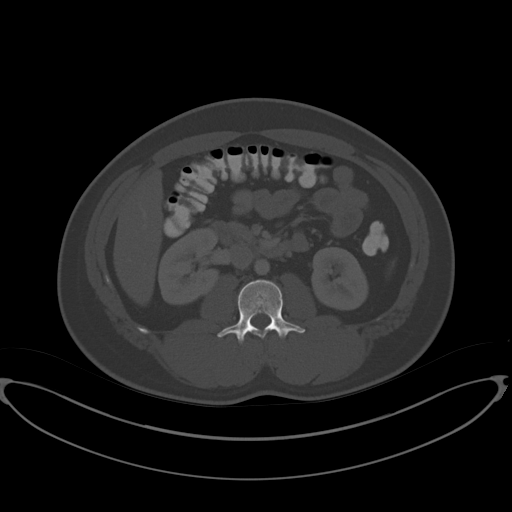
[im 65/91  soft-tissue]
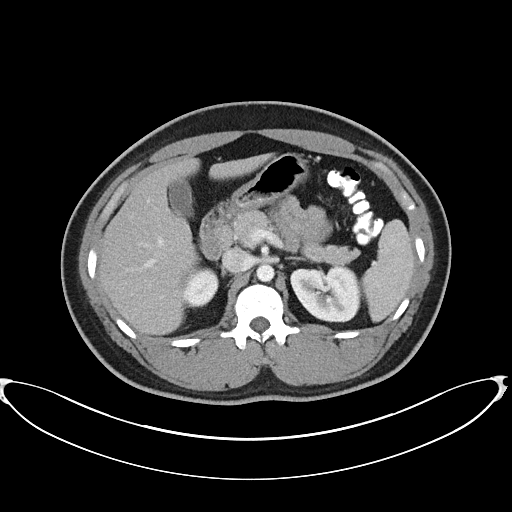
[im 73/91  soft-tissue]
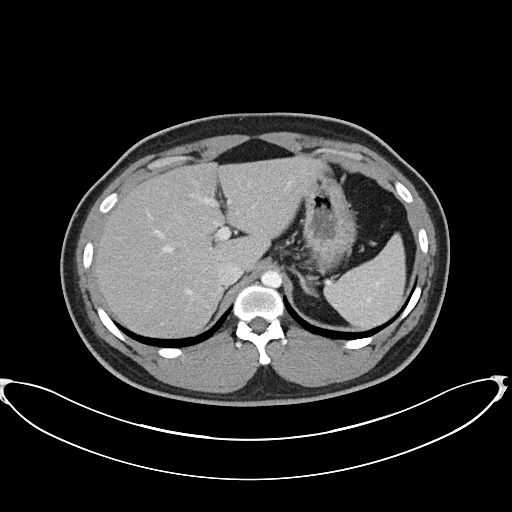
[im 80/91  soft-tissue]
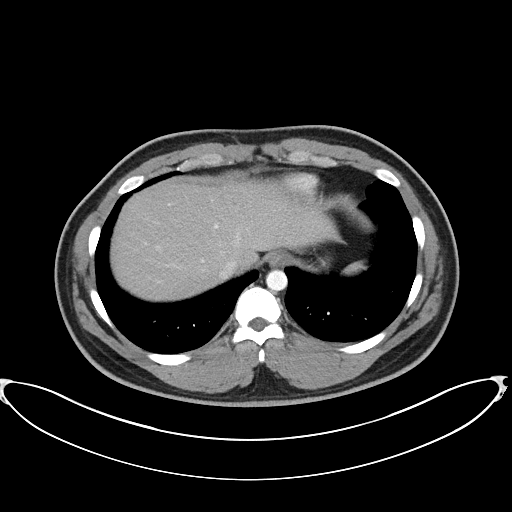
[im 87/91  soft-tissue]
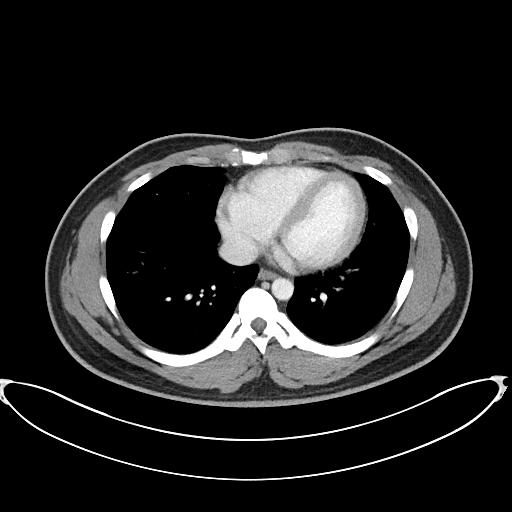

[Series 5: abd/pel w st · coronal · 0.79mm/px · 3 of 88 slices shown]
[im 30/88  soft-tissue]
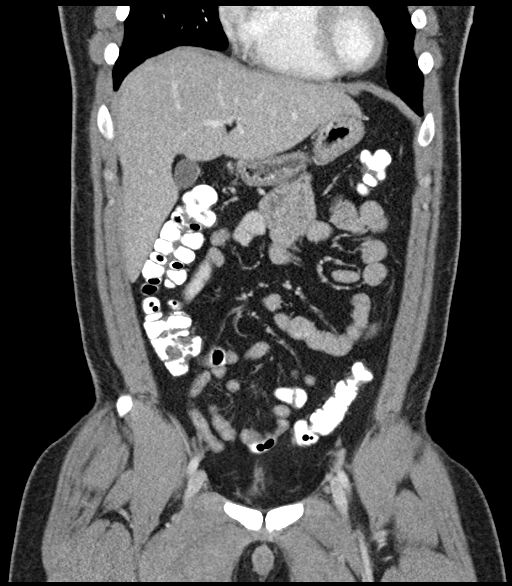
[im 39/88  soft-tissue]
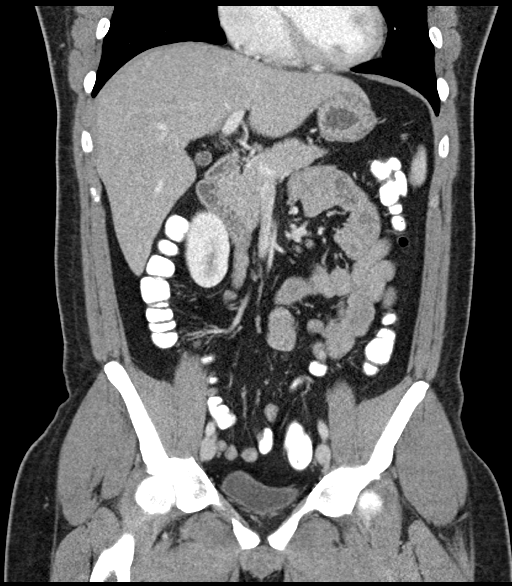
[im 49/88  soft-tissue]
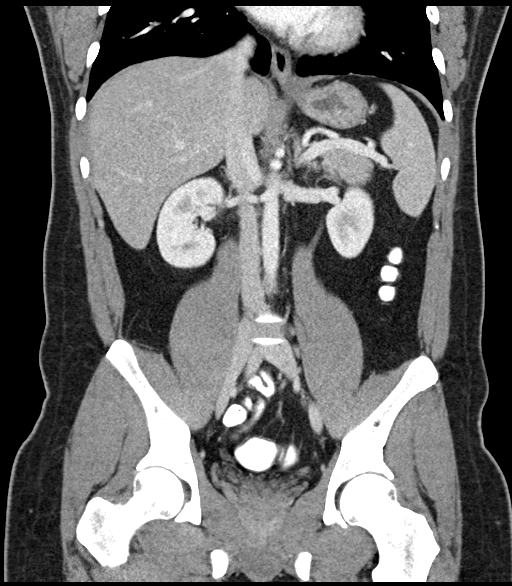

[16 of 46 positions shown; findings below may reference images not displayed]

FINDINGS: Lower chest: No acute abnormality.

Hepatobiliary: No focal liver abnormality is seen. No gallstones,
gallbladder wall thickening, or biliary dilatation. Small peripheral
right hepatic calcified granuloma noted, image 32

Pancreas: Unremarkable. No pancreatic ductal dilatation or
surrounding inflammatory changes.

Spleen: Normal in size without focal abnormality.

Adrenals/Urinary Tract: Adrenal glands are unremarkable. Kidneys are
normal, without renal calculi, focal lesion, or hydronephrosis.
Bladder is unremarkable.

Stomach/Bowel: Stomach is within normal limits. Appendix appears
normal. No evidence of bowel wall thickening, distention, or
inflammatory changes.

Vascular/Lymphatic: No significant vascular findings are present. No
enlarged abdominal or pelvic lymph nodes.

Reproductive: Seminal vesicles and prostate unremarkable. No acute
finding by CT.

Other: No abdominal wall hernia or abnormality. No abdominopelvic
ascites.

Musculoskeletal: No acute or significant osseous findings.
IMPRESSION: No acute intra-abdominopelvic finding by CT.

Incidental hepatic calcified granulomata

No significant bowel abnormality.  Negative for obstruction.

## 2023-03-31 ENCOUNTER — Encounter: Payer: Self-pay | Admitting: Gastroenterology
